# Patient Record
Sex: Male | Born: 1945 | Race: White | Hispanic: No | Marital: Married | Smoking: Former smoker
Health system: Southern US, Community
[De-identification: ages and names within clinical notes are randomized; demographics above are authoritative.]

## PROBLEM LIST (undated history)

## (undated) ENCOUNTER — Emergency Department (HOSPITAL_COMMUNITY): Disposition: A | Discharge status: Home / Self Care | Payer: Federal, State, Local not specified - PPO

## (undated) DIAGNOSIS — N401 Enlarged prostate with lower urinary tract symptoms: Secondary | ICD-10-CM

## (undated) DIAGNOSIS — Z9049 Acquired absence of other specified parts of digestive tract: Secondary | ICD-10-CM

## (undated) DIAGNOSIS — F32A Depression, unspecified: Secondary | ICD-10-CM

## (undated) DIAGNOSIS — E039 Hypothyroidism, unspecified: Secondary | ICD-10-CM

## (undated) DIAGNOSIS — M25541 Pain in joints of right hand: Secondary | ICD-10-CM

## (undated) DIAGNOSIS — G919 Hydrocephalus, unspecified: Secondary | ICD-10-CM

## (undated) DIAGNOSIS — I639 Cerebral infarction, unspecified: Secondary | ICD-10-CM

## (undated) DIAGNOSIS — E559 Vitamin D deficiency, unspecified: Secondary | ICD-10-CM

## (undated) DIAGNOSIS — F329 Major depressive disorder, single episode, unspecified: Secondary | ICD-10-CM

## (undated) DIAGNOSIS — K5730 Diverticulosis of large intestine without perforation or abscess without bleeding: Secondary | ICD-10-CM

## (undated) DIAGNOSIS — K5790 Diverticulosis of intestine, part unspecified, without perforation or abscess without bleeding: Secondary | ICD-10-CM

## (undated) DIAGNOSIS — G8929 Other chronic pain: Secondary | ICD-10-CM

## (undated) DIAGNOSIS — F411 Generalized anxiety disorder: Secondary | ICD-10-CM

## (undated) DIAGNOSIS — G2581 Restless legs syndrome: Secondary | ICD-10-CM

## (undated) DIAGNOSIS — H903 Sensorineural hearing loss, bilateral: Secondary | ICD-10-CM

## (undated) DIAGNOSIS — E739 Lactose intolerance, unspecified: Secondary | ICD-10-CM

## (undated) DIAGNOSIS — J309 Allergic rhinitis, unspecified: Secondary | ICD-10-CM

## (undated) DIAGNOSIS — I10 Essential (primary) hypertension: Secondary | ICD-10-CM

## (undated) DIAGNOSIS — H401130 Primary open-angle glaucoma, bilateral, stage unspecified: Secondary | ICD-10-CM

## (undated) DIAGNOSIS — Q631 Lobulated, fused and horseshoe kidney: Secondary | ICD-10-CM

## (undated) DIAGNOSIS — G43719 Chronic migraine without aura, intractable, without status migrainosus: Secondary | ICD-10-CM

## (undated) DIAGNOSIS — Z982 Presence of cerebrospinal fluid drainage device: Secondary | ICD-10-CM

## (undated) DIAGNOSIS — K642 Third degree hemorrhoids: Secondary | ICD-10-CM

## (undated) DIAGNOSIS — E785 Hyperlipidemia, unspecified: Secondary | ICD-10-CM

## (undated) DIAGNOSIS — M25532 Pain in left wrist: Secondary | ICD-10-CM

## (undated) HISTORY — DX: Vitamin D deficiency, unspecified: E55.9

## (undated) HISTORY — DX: Lactose intolerance, unspecified: E73.9

## (undated) HISTORY — DX: Major depressive disorder, single episode, unspecified: F32.9

## (undated) HISTORY — DX: Pain in left wrist: M25.532

## (undated) HISTORY — PX: CERVICAL SPINE SURGERY: SHX589

## (undated) HISTORY — PX: KIDNEY SURGERY: SHX687

## (undated) HISTORY — DX: Other chronic pain: G89.29

## (undated) HISTORY — DX: Primary open-angle glaucoma, bilateral, stage unspecified: H40.1130

## (undated) HISTORY — DX: Allergic rhinitis, unspecified: J30.9

## (undated) HISTORY — DX: Benign prostatic hyperplasia with lower urinary tract symptoms: N40.1

## (undated) HISTORY — DX: Restless legs syndrome: G25.81

## (undated) HISTORY — DX: Generalized anxiety disorder: F41.1

## (undated) HISTORY — DX: Third degree hemorrhoids: K64.2

## (undated) HISTORY — DX: Presence of cerebrospinal fluid drainage device: Z98.2

## (undated) HISTORY — DX: Pain in joints of right hand: M25.541

## (undated) HISTORY — DX: Sensorineural hearing loss, bilateral: H90.3

## (undated) HISTORY — PX: PARATHYROIDECTOMY: SHX19

## (undated) HISTORY — DX: Diverticulosis of large intestine without perforation or abscess without bleeding: K57.30

## (undated) HISTORY — DX: Acquired absence of other specified parts of digestive tract: Z90.49

## (undated) HISTORY — PX: THYROIDECTOMY, PARTIAL: SHX18

## (undated) HISTORY — DX: Chronic migraine without aura, intractable, without status migrainosus: G43.719

## (undated) HISTORY — PX: PARTIAL COLECTOMY: SHX5273

---

## 1975-03-30 DIAGNOSIS — G919 Hydrocephalus, unspecified: Secondary | ICD-10-CM | POA: Insufficient documentation

## 1999-12-02 DIAGNOSIS — E213 Hyperparathyroidism, unspecified: Secondary | ICD-10-CM

## 1999-12-02 HISTORY — DX: Hyperparathyroidism, unspecified: E21.3

## 2000-06-22 DIAGNOSIS — D126 Benign neoplasm of colon, unspecified: Secondary | ICD-10-CM

## 2000-06-22 HISTORY — DX: Benign neoplasm of colon, unspecified: D12.6

## 2001-09-26 DIAGNOSIS — M899 Disorder of bone, unspecified: Secondary | ICD-10-CM

## 2001-09-26 HISTORY — DX: Disorder of bone, unspecified: M89.9

## 2001-12-08 DIAGNOSIS — C649 Malignant neoplasm of unspecified kidney, except renal pelvis: Secondary | ICD-10-CM

## 2001-12-08 HISTORY — DX: Malignant neoplasm of unspecified kidney, except renal pelvis: C64.9

## 2009-03-29 DIAGNOSIS — K219 Gastro-esophageal reflux disease without esophagitis: Secondary | ICD-10-CM | POA: Insufficient documentation

## 2011-01-20 DIAGNOSIS — N209 Urinary calculus, unspecified: Secondary | ICD-10-CM

## 2011-01-20 HISTORY — DX: Urinary calculus, unspecified: N20.9

## 2011-02-17 ENCOUNTER — Other Ambulatory Visit: Payer: Self-pay

## 2011-02-17 ENCOUNTER — Encounter: Payer: Self-pay | Admitting: Emergency Medicine

## 2011-02-17 ENCOUNTER — Inpatient Hospital Stay (HOSPITAL_COMMUNITY)
Admission: EM | Admit: 2011-02-17 | Discharge: 2011-02-25 | DRG: 378 | Disposition: A | Discharge status: Home / Self Care | Payer: Medicare (Managed Care) | Attending: Internal Medicine | Admitting: Internal Medicine

## 2011-02-17 DIAGNOSIS — R509 Fever, unspecified: Secondary | ICD-10-CM

## 2011-02-17 DIAGNOSIS — N183 Chronic kidney disease, stage 3 unspecified: Secondary | ICD-10-CM | POA: Diagnosis present

## 2011-02-17 DIAGNOSIS — K922 Gastrointestinal hemorrhage, unspecified: Secondary | ICD-10-CM

## 2011-02-17 DIAGNOSIS — G2581 Restless legs syndrome: Secondary | ICD-10-CM | POA: Diagnosis present

## 2011-02-17 DIAGNOSIS — I10 Essential (primary) hypertension: Secondary | ICD-10-CM

## 2011-02-17 DIAGNOSIS — K626 Ulcer of anus and rectum: Secondary | ICD-10-CM | POA: Diagnosis present

## 2011-02-17 DIAGNOSIS — Z85528 Personal history of other malignant neoplasm of kidney: Secondary | ICD-10-CM

## 2011-02-17 DIAGNOSIS — M129 Arthropathy, unspecified: Secondary | ICD-10-CM | POA: Diagnosis present

## 2011-02-17 DIAGNOSIS — G43909 Migraine, unspecified, not intractable, without status migrainosus: Secondary | ICD-10-CM | POA: Diagnosis present

## 2011-02-17 DIAGNOSIS — E785 Hyperlipidemia, unspecified: Secondary | ICD-10-CM | POA: Diagnosis present

## 2011-02-17 DIAGNOSIS — I959 Hypotension, unspecified: Secondary | ICD-10-CM | POA: Diagnosis present

## 2011-02-17 DIAGNOSIS — E039 Hypothyroidism, unspecified: Secondary | ICD-10-CM | POA: Diagnosis present

## 2011-02-17 DIAGNOSIS — I129 Hypertensive chronic kidney disease with stage 1 through stage 4 chronic kidney disease, or unspecified chronic kidney disease: Secondary | ICD-10-CM | POA: Diagnosis present

## 2011-02-17 DIAGNOSIS — K5900 Constipation, unspecified: Secondary | ICD-10-CM | POA: Diagnosis not present

## 2011-02-17 DIAGNOSIS — F3289 Other specified depressive episodes: Secondary | ICD-10-CM | POA: Diagnosis present

## 2011-02-17 DIAGNOSIS — Z8673 Personal history of transient ischemic attack (TIA), and cerebral infarction without residual deficits: Secondary | ICD-10-CM

## 2011-02-17 DIAGNOSIS — Z7982 Long term (current) use of aspirin: Secondary | ICD-10-CM

## 2011-02-17 DIAGNOSIS — K5731 Diverticulosis of large intestine without perforation or abscess with bleeding: Principal | ICD-10-CM | POA: Diagnosis present

## 2011-02-17 DIAGNOSIS — F329 Major depressive disorder, single episode, unspecified: Secondary | ICD-10-CM | POA: Diagnosis present

## 2011-02-17 DIAGNOSIS — D72829 Elevated white blood cell count, unspecified: Secondary | ICD-10-CM | POA: Diagnosis not present

## 2011-02-17 DIAGNOSIS — Z791 Long term (current) use of non-steroidal anti-inflammatories (NSAID): Secondary | ICD-10-CM

## 2011-02-17 DIAGNOSIS — I639 Cerebral infarction, unspecified: Secondary | ICD-10-CM

## 2011-02-17 DIAGNOSIS — D500 Iron deficiency anemia secondary to blood loss (chronic): Secondary | ICD-10-CM | POA: Diagnosis present

## 2011-02-17 DIAGNOSIS — D126 Benign neoplasm of colon, unspecified: Secondary | ICD-10-CM | POA: Diagnosis present

## 2011-02-17 HISTORY — DX: Essential (primary) hypertension: I10

## 2011-02-17 HISTORY — DX: Major depressive disorder, single episode, unspecified: F32.9

## 2011-02-17 HISTORY — DX: Lobulated, fused and horseshoe kidney: Q63.1

## 2011-02-17 HISTORY — DX: Restless legs syndrome: G25.81

## 2011-02-17 HISTORY — DX: Depression, unspecified: F32.A

## 2011-02-17 HISTORY — DX: Hypothyroidism, unspecified: E03.9

## 2011-02-17 HISTORY — DX: Cerebral infarction, unspecified: I63.9

## 2011-02-17 HISTORY — DX: Hyperlipidemia, unspecified: E78.5

## 2011-02-17 HISTORY — DX: Diverticulosis of intestine, part unspecified, without perforation or abscess without bleeding: K57.90

## 2011-02-17 HISTORY — DX: Hydrocephalus, unspecified: G91.9

## 2011-02-17 HISTORY — DX: Chronic kidney disease, stage 3 unspecified: N18.30

## 2011-02-17 LAB — CBC
Hemoglobin: 14.9 g/dL (ref 13.0–17.0)
Hemoglobin: 14.1 g/dL (ref 13.0–17.0)
MCH: 30.5 pg (ref 26.0–34.0)
MCH: 30.5 pg (ref 26.0–34.0)
MCHC: 33.3 g/dL (ref 30.0–36.0)
RBC: 4.89 MIL/uL (ref 4.22–5.81)

## 2011-02-17 LAB — COMPREHENSIVE METABOLIC PANEL
ALT: 29 U/L (ref 0–53)
Alkaline Phosphatase: 47 U/L (ref 39–117)
CO2: 28 mEq/L (ref 19–32)
Calcium: 10.8 mg/dL — ABNORMAL HIGH (ref 8.4–10.5)
GFR calc Af Amer: 37 mL/min — ABNORMAL LOW (ref >90)
GFR calc non Af Amer: 32 mL/min — ABNORMAL LOW (ref >90)
Glucose, Bld: 134 mg/dL — ABNORMAL HIGH (ref 70–99)
Sodium: 141 mEq/L (ref 135–145)

## 2011-02-17 LAB — PROTIME-INR: INR: 1.01 (ref 0.00–1.49)

## 2011-02-17 MED ORDER — SODIUM CHLORIDE 0.9 % IV BOLUS (SEPSIS)
1000.0000 mL | Freq: Once | INTRAVENOUS | Status: AC
  Administered 2011-02-17: 1000 mL via INTRAVENOUS

## 2011-02-17 MED ORDER — SODIUM CHLORIDE 0.9 % IV SOLN
INTRAVENOUS | Status: AC
  Administered 2011-02-17: 19:00:00 via INTRAVENOUS

## 2011-02-17 NOTE — ED Provider Notes (Signed)
History     CSN: [id number] Arrival date & time: 02/17/2011  3:46 PM   First MD Initiated Contact with Patient 02/17/11 1613      Chief Complaint  Patient presents with  . Rectal Bleeding    (Consider location/radiation/quality/duration/timing/severity/associated sxs/prior treatment) HPI Pt. Complains of brisk painless rectal bleeding with weakness and dizziness.  Hx of diverticulosis.  Pt also takes Plavix and asa because of previous CVA.  No hx of PUD or other GI bleeding..  No ETOH abuse per patient.   Past Medical History  Diagnosis Date  . CVA (cerebral infarction)   . Hydrocephalus     with shunt  . Hypothyroid   . Renal disorder   . Cancer   . Depression   . Restless leg syndrome   . Hyperlipemia   . Hypertension     Past Surgical History  Procedure Date  . Colon surgery   . Kidney surgery     No family history on file.  History  Substance Use Topics  . Smoking status: Not on file  . Smokeless tobacco: Not on file  . Alcohol Use:       Review of Systems  Unable to perform ROS   Allergies  Review of patient's allergies indicates not on file.  Home Medications  No current outpatient prescriptions on file.  BP 96/67  Pulse 88  Temp(Src) 98.3 F (36.8 C) (Oral)  Resp 16  SpO2 93%  Physical Exam  Nursing note and vitals reviewed. Constitutional: He is oriented to person, place, and time. He appears well-developed and well-nourished. He appears distressed.  HENT:  Head: Normocephalic and atraumatic.  Eyes: Pupils are equal, round, and reactive to light.  Neck: Normal range of motion.  Cardiovascular: Normal rate, regular rhythm and intact distal pulses.          Date: 02/17/2011  Rate: 83  Rhythm: normal sinus rhythm  QRS Axis: normal  Intervals: normal  ST/T Wave abnormalities: nonspecific T wave changes  Conduction Disutrbances:none:   Old EKG Reviewed: none available     Pulmonary/Chest: No respiratory distress.  Abdominal:  Normal appearance. He exhibits no distension.  Musculoskeletal: Normal range of motion.  Neurological: He is alert and oriented to person, place, and time. No cranial nerve deficit.  Skin: Skin is warm and dry. No bruising and no rash noted. There is pallor.  Psychiatric: He has a normal mood and affect. His behavior is normal.    ED Course  Procedures (including critical care time)   Dr. Juanda Chance consulted.  Will see in hospital  Patient admitted to [facility]   Labs Reviewed  CBC - Abnormal; Notable for the following:    WBC 16.2 (*)    All other components within normal limits  COMPREHENSIVE METABOLIC PANEL - Abnormal; Notable for the following:    Glucose, Bld 134 (*)    BUN 25 (*)    Creatinine, Ser 2.05 (*)    Calcium 10.8 (*)    GFR calc non Af Amer 32 (*)    GFR calc Af Amer 37 (*)    All other components within normal limits  CBC - Abnormal; Notable for the following:    WBC 11.5 (*)    All other components within normal limits  BASIC METABOLIC PANEL - Abnormal; Notable for the following:    Glucose, Bld 104 (*)    Creatinine, Ser 1.37 (*)    GFR calc non Af Amer 52 (*)    GFR calc  Af Amer 60 (*)    All other components within normal limits  CBC - Abnormal; Notable for the following:    RBC 3.98 (*)    Hemoglobin 12.0 (*)    HCT 36.4 (*)    All other components within normal limits  TYPE AND SCREEN  PROTIME-INR  ABO/RH  MRSA PCR SCREENING  CBC  CBC  CBC  PREPARE RBC (CROSSMATCH)   No results found.   1. Lower GI bleed   2. Hypotension       MDM  CRITICAL CARE Performed by: Nelva Nay L   Total critical care time: 30  Critical care time was exclusive of separately billable procedures and treating other patients.  Critical care was necessary to treat or prevent imminent or life-threatening deterioration.  Critical care was time spent personally by me on the following activities: development of treatment plan with patient and/or  surrogate as well as nursing, discussions with consultants, evaluation of patient's response to treatment, examination of patient, obtaining history from patient or surrogate, ordering and performing treatments and interventions, ordering and review of laboratory studies, ordering and review of radiographic studies, pulse oximetry and re-evaluation of patient's condition.        Nelia Shi, MD 02/18/11 1125

## 2011-02-17 NOTE — ED Notes (Signed)
MD at bedside. 

## 2011-02-17 NOTE — ED Notes (Signed)
Pt's wife reports he passed a large amount of blood per rectum x4 starting around 1230 today. Pt with hx diverticulosis and takes anti-platlet medications for CVA hx

## 2011-02-17 NOTE — Consult Note (Signed)
[facility] Consultation  Referring Prov Primary Care Physician:   Primary Gastroenterologist:  [facility].  Reason for Consultation:  Large volume hematochezia  HPI: Gregory Kline is a 65 y.o. male just moved to [place] from [place], [place],  In Oct 2012, today developed  BRB per rectum around 12.30 pm, followed by 4 bloody stools. Denies abd. Pain. No stools x 3 hours. Initially hypotensive, responded to fluid challenge. Hx of segmental colectomy for diverticulitis with abcess in 1996, since then several colonoscopies, last one about 11/2 years ago at [facility]. No polyps. Denies change in bowl habits. Hx of stroke 03/2007, 12/2008, and in 2011 resulting in poor memory. Wife gives most of the hx. He has been on Plavix long term. Hx horseshoe kidney, s/p halfnephrectomy. For renal carcinoma. No F hx of colon cancer   Past Medical History  Diagnosis Date  . CVA (cerebral infarction)   . Hydrocephalus     with shunt  . Hypothyroid   . Diverticulosis   . Depression   . Restless leg syndrome   . Hyperlipemia   . Hypertension   . Horseshoe kidney   . Renal cell carcinoma     Past Surgical History  Procedure Date  . Thyroidectomy, partial   . Kidney surgery   . Partial colectomy   . Parathyroidectomy   . Cervical spine surgery     benign tumor    Prior to Admission medications   Medication Sig Start Date End Date Taking? Authorizing Provider  atorvastatin (LIPITOR) 20 MG tablet Take 40 mg by mouth daily.    Yes Historical Provider, MD  azithromycin (ZITHROMAX) 500 MG tablet Take 500 mg by mouth daily.     Yes Historical Provider, MD  clopidogrel (PLAVIX) 75 MG tablet Take 75 mg by mouth daily.     Yes Historical Provider, MD  dextran 70-hypromellose (TEARS RENEWED) ophthalmic solution Place 1 drop into both eyes 2 (two) times daily.     Yes Historical Provider, MD  DULoxetine (CYMBALTA) 60 MG capsule Take 60 mg by mouth 2 (two) times daily.     Yes  Historical Provider, MD  finasteride (PROSCAR) 5 MG tablet Take 5 mg by mouth daily.     Yes Historical Provider, MD  levothyroxine (SYNTHROID, LEVOTHROID) 100 MCG tablet Take 100 mcg by mouth at bedtime.    Yes Historical Provider, MD  loratadine (CLARITIN REDITABS) 10 MG dissolvable tablet Take 10 mg by mouth as needed. For eyes dryness and itching   Yes Historical Provider, MD  losartan-hydrochlorothiazide (HYZAAR) 100-25 MG per tablet Take 1 tablet by mouth daily.     Yes Historical Provider, MD  pramipexole (MIRAPEX) 0.5 MG tablet Take 0.5 mg by mouth 1 day or 1 dose.     Yes Historical Provider, MD  risedronate (ACTONEL) 35 MG tablet Take 35 mg by mouth every 7 (seven) days. with water on empty stomach, nothing by mouth or lie down for next 30 minutes.takes on sunday   Yes Historical Provider, MD  simvastatin (ZOCOR) 20 MG tablet Take 20 mg by mouth at bedtime.     Yes Historical Provider, MD  Travoprost, BAK Free, (TRAVATAN) 0.004 % SOLN ophthalmic solution Place 1 drop into both eyes at bedtime.     Yes Historical Provider, MD  acetaminophen-codeine (TYLENOL #3) 300-30 MG per tablet Take 1 tablet by mouth every 4 (four) hours as needed.      Historical Provider, MD  almotriptan (AXERT) 12.5 MG tablet Take  12.5 mg by mouth as needed. may repeat in 2 hours if needed     Historical Provider, MD  aspirin 325 MG tablet Take 325 mg by mouth daily.      Historical Provider, MD  diclofenac sodium (VOLTAREN) 1 % GEL Apply topically.      Historical Provider, MD    Current Facility-Administered Medications  Medication Dose Route Frequency Provider Last Rate Last Dose  . 0.9 %  sodium chloride infusion   Intravenous STAT Nelia Shi, MD 125 mL/hr at 02/17/11 1912    . sodium chloride 0.9 % bolus 1,000 mL  1,000 mL Intravenous Once Nelia Shi, MD   1,000 mL at 02/17/11 1627  . sodium chloride 0.9 % bolus 1,000 mL  1,000 mL Intravenous Once Nelia Shi, MD   1,000 mL at 02/17/11 1627    Current Outpatient Prescriptions  Medication Sig Dispense Refill  . atorvastatin (LIPITOR) 20 MG tablet Take 40 mg by mouth daily.       Marland Kitchen azithromycin (ZITHROMAX) 500 MG tablet Take 500 mg by mouth daily.        . clopidogrel (PLAVIX) 75 MG tablet Take 75 mg by mouth daily.        Marland Kitchen dextran 70-hypromellose (TEARS RENEWED) ophthalmic solution Place 1 drop into both eyes 2 (two) times daily.        . DULoxetine (CYMBALTA) 60 MG capsule Take 60 mg by mouth 2 (two) times daily.        . finasteride (PROSCAR) 5 MG tablet Take 5 mg by mouth daily.        Marland Kitchen levothyroxine (SYNTHROID, LEVOTHROID) 100 MCG tablet Take 100 mcg by mouth at bedtime.       Marland Kitchen loratadine (CLARITIN REDITABS) 10 MG dissolvable tablet Take 10 mg by mouth as needed. For eyes dryness and itching      . losartan-hydrochlorothiazide (HYZAAR) 100-25 MG per tablet Take 1 tablet by mouth daily.        . pramipexole (MIRAPEX) 0.5 MG tablet Take 0.5 mg by mouth 1 day or 1 dose.        . risedronate (ACTONEL) 35 MG tablet Take 35 mg by mouth every 7 (seven) days. with water on empty stomach, nothing by mouth or lie down for next 30 minutes.takes on sunday      . simvastatin (ZOCOR) 20 MG tablet Take 20 mg by mouth at bedtime.        . Travoprost, BAK Free, (TRAVATAN) 0.004 % SOLN ophthalmic solution Place 1 drop into both eyes at bedtime.        Marland Kitchen acetaminophen-codeine (TYLENOL #3) 300-30 MG per tablet Take 1 tablet by mouth every 4 (four) hours as needed.        Marland Kitchen almotriptan (AXERT) 12.5 MG tablet Take 12.5 mg by mouth as needed. may repeat in 2 hours if needed       . aspirin 325 MG tablet Take 325 mg by mouth daily.        . diclofenac sodium (VOLTAREN) 1 % GEL Apply topically.          Allergies as of 02/17/2011 - Review Complete 02/17/2011  Allergen Reaction Noted  . Cafergot Rash 02/17/2011  . Celebrex (celecoxib) Rash 02/17/2011  . Latex Rash 02/17/2011  . Sulfa antibiotics Rash 02/17/2011    Family History  Problem  Relation Age of Onset  . Cancer Mother   . Leukemia Father     History   Social History  .  Marital Status: Married    Spouse Name: N/A    Number of Children: N/A  . Years of Education: N/A   Occupational History  . Not on file.   Social History Main Topics  . Smoking status: Former Games developer  . Smokeless tobacco: Never Used  . Alcohol Use: 0.5 oz/week    1 drink(s) per week     occassional  . Drug Use: No     quit in teens (smoked approx. 6 months)  . Sexually Active: Not on file   Other Topics Concern  . Not on file   Social History Narrative  . No narrative on file    Review of Systems: Gen: Denies any fever, chills, sweats, anorexia, fatigue, weakness, malaise, weight loss, and sleep disorder CV: Denies chest pain, angina, palpitations, syncope, orthopnea, PND, peripheral edema, and claudication. Resp: Denies dyspnea at rest, dyspnea with exercise, cough, sputum, wheezing, coughing up blood, and pleurisy. GI: Denies vomiting blood, jaundice, and fecal incontinence.   Denies dysphagia or odynophagia. GU : Denies urinary burning, blood in urine, urinary frequency, urinary hesitancy, nocturnal urination, and urinary incontinence. MS: Denies joint pain, limitation of movement, and swelling, stiffness, low back pain, extremity pain. Denies muscle weakness, cramps, atrophy.  .  Psych: hx of depression, anxiety, memory loss,, and confusion. Heme: Denies bruising, bleeding, and enlarged lymph nodes. Neuro:  Denies any headaches, dizziness, paresthesias. Endo:  Denies any problems with DM, thyroid, adrenal function.  Physical Exam: Vital signs in last 24 hours: Temp:  [98 F (36.7 C)-98.3 F (36.8 C)] 98 F (36.7 C) (11/21 1919) Pulse Rate:  [75-90] 85  (11/21 2100) Resp:  [10-22] 15  (11/21 2100) BP: (62-126)/(37-79) 100/71 mmHg (11/21 2100) SpO2:  [93 %-100 %] 98 % (11/21 2100)   General:   Alert,  Well-developed, well-nourished, pleasant and cooperative in  NAD Head:  Normocephalic and atraumatic. Eyes:  Sclera clear, no icterus.   Conjunctiva pink. Ears:  Normal auditory acuity. Nose:  No deformity, discharge,  or lesions. Mouth:  No deformity or lesions.  Oropharynx pink & moist. Neck:  Supple; no masses or thyromegaly. Lungs:  Clear throughout to auscultation.   No wheezes, crackles, or rhonchi. No acute distress. Heart:  Regular rate and rhythm; no murmurs, clicks, rubs,  or gallops. Abdomen:    Soft, decreased muscle tone, well healed midline scar, active bowl sounds, no mass, mild diffuse tenderness Rectal.  Gregory Kline blood, no stool, small amount Msk:  Symmetrical without gross deformities. Normal posture. Pulses:  Normal pulses noted. Extremities:  Without clubbing or edema. Neurologic:  Alert and  oriented x4;  grossly normal neurologically. Skin:  Intact without significant lesions or rashes.  Psych:  Alert and cooperative. Normal mood and affect.  Intake/Output from previous day:   Intake/Output this shift: Total I/O In: -  Out: 175 [Urine:175]  Lab Results:  [facility] 02/17/11 1610  WBC 16.2*  HGB 14.9  HCT 44.9  PLT 242   BMET  Basename 02/17/11 1610  NA 141  K 4.0  CL 104  CO2 28  GLUCOSE 134*  BUN 25*  CREATININE 2.05*  CALCIUM 10.8*   LFT  Basename 02/17/11 1610  PROT 6.6  ALBUMIN 4.0  AST 19  ALT 29  ALKPHOS 47  BILITOT 0.5  BILIDIR --  IBILI --   PT/INR  Basename 02/17/11 1610  LABPROT 13.5  INR 1.01   Hepatitis Panel No results found for this basename: HEPBSAG,HCVAB,HEPAIGM,HEPBIGM in the last 72 hours   Studies/Results:  No results found.   Previous Endoscopies: See HPI  Impression / Plan:Acute LGIB  Associated with transient hypotension, responded to fluid resuscitation, now hemodynamically stable, no stools x 3 hours. 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

## 2011-02-17 NOTE — H&P (Signed)
History and Physical  Gregory Kline WJX:[id number] DOB: [birth year] DOA: 02/17/2011  Referring physician: Nelva Nay, M.D. PCP:  Patient just relocated recently from [place]. [facility].  Chief Complaint: Bleeding  HPI:  65 year old man who presents with a history of painless lower GI bleeding. He occasionally has a little bit of blood with stools which he has attributed to hemorrhoids recently had frank bleeding before. Today he had 4 discrete episodes of voluminous bright red blood per rectum. He does have a history of diverticulosis and last had colonoscopy approximately one to 2 years ago in [place]. He has had no bleeding here in the emergency department however he was noted be hypotensive and in discussion with emergency department physician it is felt the patient should be amended to step down unit. Workup was notable for a creatinine 2.05 and a leukocytosis of 15.2. Hemoglobin was unremarkable. EKG was unremarkable. Gastroenterology is ready been consulted by the emergency department.  Review of Systems:  Negative for fever, changes to his vision, sore throat, rash, muscle aches, chest pain, shortness of breath, dysuria, abdominal pain, nausea, vomiting.  Past Medical History  Diagnosis Date  . CVA (cerebral infarction)   . Hydrocephalus     with shunt  . Hypothyroid   . Diverticulosis   . Depression   . Restless leg syndrome   . Hyperlipemia   . Hypertension   . Horseshoe kidney   . Renal cell carcinoma    Past Surgical History  Procedure Date  . Thyroidectomy, partial   . Kidney surgery   . Partial colectomy   . Parathyroidectomy   . Cervical spine surgery     benign tumor   Social History:  reports that he has quit smoking. He has never used smokeless tobacco. He reports that he drinks about .5 ounces of alcohol per week. He reports that he does not use illicit drugs.  Allergies  Allergen Reactions  . Cafergot Rash  . Celebrex (Celecoxib) Rash  .  Latex Rash    Sensitivity per pt  . Sulfa Antibiotics Rash   Family History  Problem Relation Age of Onset  . Cancer Mother   . Leukemia Father    Prior to Admission medications   Medication Sig Start Date End Date Taking? Authorizing Provider  atorvastatin (LIPITOR) 20 MG tablet Take 40 mg by mouth daily.    Yes Historical Provider, MD  azithromycin (ZITHROMAX) 500 MG tablet Take 500 mg by mouth daily.     Yes Historical Provider, MD  clopidogrel (PLAVIX) 75 MG tablet Take 75 mg by mouth daily.     Yes Historical Provider, MD  dextran 70-hypromellose (TEARS RENEWED) ophthalmic solution Place 1 drop into both eyes 2 (two) times daily.     Yes Historical Provider, MD  DULoxetine (CYMBALTA) 60 MG capsule Take 60 mg by mouth 2 (two) times daily.     Yes Historical Provider, MD  finasteride (PROSCAR) 5 MG tablet Take 5 mg by mouth daily.     Yes Historical Provider, MD  levothyroxine (SYNTHROID, LEVOTHROID) 100 MCG tablet Take 100 mcg by mouth at bedtime.    Yes Historical Provider, MD  loratadine (CLARITIN REDITABS) 10 MG dissolvable tablet Take 10 mg by mouth as needed. For eyes dryness and itching   Yes Historical Provider, MD  losartan-hydrochlorothiazide (HYZAAR) 100-25 MG per tablet Take 1 tablet by mouth daily.     Yes Historical Provider, MD  pramipexole (MIRAPEX) 0.5 MG tablet Take 0.5 mg by mouth 1 day or  1 dose.     Yes Historical Provider, MD  risedronate (ACTONEL) 35 MG tablet Take 35 mg by mouth every 7 (seven) days. with water on empty stomach, nothing by mouth or lie down for next 30 minutes.takes on sunday   Yes Historical Provider, MD  simvastatin (ZOCOR) 20 MG tablet Take 20 mg by mouth at bedtime.     Yes Historical Provider, MD  Travoprost, BAK Free, (TRAVATAN) 0.004 % SOLN ophthalmic solution Place 1 drop into both eyes at bedtime.     Yes Historical Provider, MD  acetaminophen-codeine (TYLENOL #3) 300-30 MG per tablet Take 1 tablet by mouth every 4 (four) hours as needed.       Historical Provider, MD  almotriptan (AXERT) 12.5 MG tablet Take 12.5 mg by mouth as needed. may repeat in 2 hours if needed     Historical Provider, MD  aspirin 325 MG tablet Take 325 mg by mouth daily.      Historical Provider, MD  diclofenac sodium (VOLTAREN) 1 % GEL Apply topically.      Historical Provider, MD   Physical Exam: Blood pressure 126/66, pulse 75, temperature 98.3 F (36.8 C), temperature source Oral, resp. rate 10, SpO2 99.00%.   General:  Well-appearing. Examined while sitting up in emergency department stretcher.  Eyes: Pupils equal round reactive to light. Normal lids, irises the conjunctiva.  ENT: Somewhat hard of hearing. Normal lips and tongue.  Neck: No lymphadenopathy or masses. No thyromegaly.  Cardiovascular: Regular rate and rhythm. No murmur, rub or gallop.  Respiratory: Clear to auscultation bilaterally. No wheezes, rales or rhonchi. Normal respiratory effort.  Abdomen: Soft, nontender nondistended.  Skin: Appears grossly normal without rash or induration.  Musculoskeletal: Grossly normal tone and strength in the upper and lower showed extremities bilaterally.  Psychiatric: Grossly normal mood and affect.  Neurologic: Grossly normal.  Labs on Admission:  Basic Metabolic Panel:  Lab 02/17/11 1610  NA 141  K 4.0  CL 104  CO2 28  GLUCOSE 134*  BUN 25*  CREATININE 2.05*  CALCIUM 10.8*  MG --  PHOS --   Liver Function Tests:  Lab 02/17/11 1610  AST 19  ALT 29  ALKPHOS 47  BILITOT 0.5  PROT 6.6  ALBUMIN 4.0   CBC:  Lab 02/17/11 1610  WBC 16.2*  NEUTROABS --  HGB 14.9  HCT 44.9  MCV 91.8  PLT 242   EKG: Independently reviewed. 1556: Normal sinus rhythm. Left axis deviation. No acute changes. 1607: Normal sinus rhythm. Left axis deviation. Left anterior fascicular block. No acute changes.  Assessment/Plan 1. Lower GI bleed/hypotension: Most likely diverticular given his history and known diverticulosis. No bleeding in the  emergency department. Serial CBCs. Blood pressure has responded well to IV fluids. Continue IV fluids for blood pressure support. Asymptomatic. 2. 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 3. Hypertension: Stable. 4. Hypothyroidism: Stable. 5. History of stroke: Hold aspirin and Plavix right now given above.  Code Status: Full Family Communication: Spoke with wife at bedside. Disposition Plan: Home when stable.     Brendia Sacks, MD  [facility] Pager [phone] 02/17/2011, 7:11 PM

## 2011-02-18 ENCOUNTER — Inpatient Hospital Stay (HOSPITAL_COMMUNITY): Payer: Medicare (Managed Care)

## 2011-02-18 ENCOUNTER — Encounter (HOSPITAL_COMMUNITY): Payer: Self-pay | Admitting: Radiology

## 2011-02-18 LAB — CBC
HCT: 29.9 % — ABNORMAL LOW (ref 39.0–52.0)
Hemoglobin: 10.6 g/dL — ABNORMAL LOW (ref 13.0–17.0)
MCH: 30.2 pg (ref 26.0–34.0)
MCHC: 33 g/dL (ref 30.0–36.0)
MCHC: 35.5 g/dL (ref 30.0–36.0)
Platelets: 178 10*3/uL (ref 150–400)
RBC: 3.98 MIL/uL — ABNORMAL LOW (ref 4.22–5.81)
RBC: 3.34 MIL/uL — ABNORMAL LOW (ref 4.22–5.81)
RDW: 13.1 % (ref 11.5–15.5)
WBC: 17.4 10*3/uL — ABNORMAL HIGH (ref 4.0–10.5)

## 2011-02-18 LAB — BASIC METABOLIC PANEL
Calcium: 9.1 mg/dL (ref 8.4–10.5)
GFR calc non Af Amer: 52 mL/min — ABNORMAL LOW (ref >90)
Glucose, Bld: 104 mg/dL — ABNORMAL HIGH (ref 70–99)
Sodium: 138 mEq/L (ref 135–145)

## 2011-02-18 LAB — MRSA PCR SCREENING: MRSA by PCR: NEGATIVE

## 2011-02-18 MED ORDER — ONDANSETRON HCL 4 MG PO TABS: 4.0000 mg | ORAL_TABLET | Freq: Four times a day (QID) | ORAL | Status: DC | PRN

## 2011-02-18 MED ORDER — ACETAMINOPHEN 325 MG PO TABS: 650.0000 mg | ORAL_TABLET | Freq: Four times a day (QID) | ORAL | Status: DC | PRN

## 2011-02-18 MED ORDER — TECHNETIUM TC 99M-LABELED RED BLOOD CELLS IV KIT
23.4000 | PACK | Freq: Once | INTRAVENOUS | Status: AC | PRN
Start: 2011-02-18 — End: 2011-02-18
  Administered 2011-02-18: 23.4 via INTRAVENOUS

## 2011-02-18 MED ORDER — ZOLPIDEM TARTRATE 5 MG PO TABS: 5.0000 mg | ORAL_TABLET | Freq: Every evening | ORAL | Status: DC | PRN

## 2011-02-18 MED ORDER — TRAVOPROST (BAK FREE) 0.004 % OP SOLN
1.0000 [drp] | Freq: Every day | OPHTHALMIC | Status: DC
  Administered 2011-02-18 – 2011-02-24 (×7): 1 [drp] via OPHTHALMIC
  Filled 2011-02-18: qty 2.5

## 2011-02-18 MED ORDER — LEVOTHYROXINE SODIUM 100 MCG PO TABS
100.0000 ug | ORAL_TABLET | Freq: Every day | ORAL | Status: DC
  Administered 2011-02-18 – 2011-02-24 (×6): 100 ug via ORAL
  Filled 2011-02-18 (×8): qty 1

## 2011-02-18 MED ORDER — MORPHINE SULFATE 2 MG/ML IJ SOLN
2.0000 mg | INTRAMUSCULAR | Status: DC | PRN
  Administered 2011-02-18: 2 mg via INTRAVENOUS
  Filled 2011-02-18: qty 1

## 2011-02-18 MED ORDER — TEARS RENEWED OP SOLN
1.0000 [drp] | Freq: Two times a day (BID) | OPHTHALMIC | Status: DC
  Administered 2011-02-18 (×2): 1 [drp] via OPHTHALMIC
  Filled 2011-02-18 (×3): qty 15

## 2011-02-18 MED ORDER — ACETAMINOPHEN 650 MG RE SUPP: 650.0000 mg | Freq: Four times a day (QID) | RECTAL | Status: DC | PRN

## 2011-02-18 MED ORDER — SIMVASTATIN 20 MG PO TABS
20.0000 mg | ORAL_TABLET | Freq: Every day | ORAL | Status: DC
  Administered 2011-02-20 – 2011-02-24 (×5): 20 mg via ORAL
  Filled 2011-02-18 (×9): qty 1

## 2011-02-18 MED ORDER — DULOXETINE HCL 60 MG PO CPEP
60.0000 mg | ORAL_CAPSULE | Freq: Two times a day (BID) | ORAL | Status: DC
  Administered 2011-02-18 – 2011-02-25 (×14): 60 mg via ORAL
  Filled 2011-02-18 (×17): qty 1

## 2011-02-18 MED ORDER — FINASTERIDE 5 MG PO TABS
5.0000 mg | ORAL_TABLET | Freq: Every day | ORAL | Status: DC
  Administered 2011-02-18: 5 mg via ORAL
  Filled 2011-02-18 (×2): qty 1

## 2011-02-18 MED ORDER — SODIUM CHLORIDE 0.9 % IV BOLUS (SEPSIS)
1000.0000 mL | Freq: Once | INTRAVENOUS | Status: AC
  Administered 2011-02-18: 1000 mL via INTRAVENOUS

## 2011-02-18 MED ORDER — SODIUM CHLORIDE 0.9 % IV SOLN
INTRAVENOUS | Status: DC
  Administered 2011-02-18 – 2011-02-20 (×2): via INTRAVENOUS

## 2011-02-18 MED ORDER — POLYVINYL ALCOHOL 1.4 % OP SOLN
1.0000 [drp] | Freq: Two times a day (BID) | OPHTHALMIC | Status: DC
  Administered 2011-02-18 – 2011-02-25 (×16): 1 [drp] via OPHTHALMIC
  Filled 2011-02-18: qty 15

## 2011-02-18 MED ORDER — ONDANSETRON HCL 4 MG/2ML IJ SOLN: 4.0000 mg | Freq: Four times a day (QID) | INTRAMUSCULAR | Status: DC | PRN

## 2011-02-18 MED ORDER — HYDROCODONE-ACETAMINOPHEN 5-325 MG PO TABS
1.0000 | ORAL_TABLET | ORAL | Status: DC | PRN
  Administered 2011-02-24 (×2): 2 via ORAL
  Filled 2011-02-18 (×2): qty 2

## 2011-02-18 MED ORDER — VITAMINS A & D EX OINT
TOPICAL_OINTMENT | CUTANEOUS | Status: AC
  Administered 2011-02-18: 18:00:00
  Filled 2011-02-18: qty 5

## 2011-02-18 NOTE — Progress Notes (Signed)
Subjective: Patient with 2 large BRBPR this morning with hypotension with SBP in 80s. Patient denies any abdominal pain. No SOB. No complaints.  Objective: Vital signs in last 24 hours: Filed Vitals:   02/17/11 2330 02/18/11 0215 02/18/11 0400 02/18/11 0620  BP: 114/83 125/76 116/82 131/90  Pulse:  76 85 94  Temp: 98.7 F (37.1 C)  97.6 F (36.4 C)   TempSrc: Oral  Oral   Resp:  17 16 18   Height: 5\' 9"  (1.753 m)     Weight: 85.6 kg (188 lb 11.4 oz)     SpO2:  98% 97% 100%    Intake/Output Summary (Last 24 hours) at 02/18/11 0843 Last data filed at 02/18/11 0600  Gross per 24 hour  Intake   1050 ml  Output   1225 ml  Net   -175 ml    Weight change:   General: Alert, awake, oriented x3, in no acute distress. Heart: Tachycardia, without murmurs, rubs, gallops. Lungs: Clear to auscultation bilaterally. Abdomen: Soft, nontender, nondistended, positive bowel sounds. Extremities: No clubbing cyanosis or edema with positive pedal pulses. Neuro: Grossly intact, nonfocal.   Lab Results:  [facility] 02/18/11 0542 02/17/11 1610  NA 138 141  K 4.0 4.0  CL 109 104  CO2 23 28  GLUCOSE 104* 134*  BUN 22 25*  CREATININE 1.37* 2.05*  CALCIUM 9.1 10.8*  MG -- --  PHOS -- --    Basename 02/17/11 1610  AST 19  ALT 29  ALKPHOS 47  BILITOT 0.5  PROT 6.6  ALBUMIN 4.0   No results found for this basename: LIPASE:2,AMYLASE:2 in the last 72 hours  Basename 02/18/11 0542 02/17/11 2250  WBC 9.5 11.5*  NEUTROABS -- --  HGB 12.0* 14.1  HCT 36.4* 42.3  MCV 91.5 91.6  PLT 178 205   No results found for this basename: CKTOTAL:3,CKMB:3,CKMBINDEX:3,TROPONINI:3 in the last 72 hours No results found for this basename: POCBNP:3 in the last 72 hours No results found for this basename: DDIMER:2 in the last 72 hours No results found for this basename: HGBA1C:2 in the last 72 hours No results found for this basename: CHOL:2,HDL:2,LDLCALC:2,TRIG:2,CHOLHDL:2,LDLDIRECT:2 in the last 72  hours No results found for this basename: TSH,T4TOTAL,FREET3,T3FREE,THYROIDAB in the last 72 hours No results found for this basename: VITAMINB12:2,FOLATE:2,FERRITIN:2,TIBC:2,IRON:2,RETICCTPCT:2 in the last 72 hours  Micro Results: Recent Results (from the past 240 hour(s))  MRSA PCR SCREENING     Status: Normal   Collection Time   02/18/11 12:42 AM      Component Value Range Status Comment   MRSA by PCR NEGATIVE  NEGATIVE  Final     Studies/Results: No results found.  Medications:    . sodium chloride   Intravenous STAT  . dextran 70-hypromellose  1 drop Both Eyes BID  . DULoxetine  60 mg Oral BID  . finasteride  5 mg Oral Daily  . levothyroxine  100 mcg Oral QHS  . polyvinyl alcohol  1 drop Both Eyes BID  . simvastatin  20 mg Oral QPC supper  . sodium chloride  1,000 mL Intravenous Once  . sodium chloride  1,000 mL Intravenous Once  . Travoprost (BAK Free)  1 drop Both Eyes QHS    Assessment/Plan Principal Problem:  *Lower GI bleed Active Problems:  Hypotension  Chronic kidney disease, stage III (moderate)  Hypertension  Hypothyroidism  History of stroke  1. Hypotension - Secondary to GIB. Responding to IVF. Patient to be transfused 2 units PRBCs. Follow. Continue IVF. 2. GIB -  Likely secondary to LGIB secondary to diverticular bleed. Patient with 2 large bloddy BM. Patient with drop in Hgb 14 to 12. Orders for transfusion already placed per GI. Tagged RBC scan pending. Continue serial CBC. Plavix on hold. Gi ff and appreciate input and rxcs. 3. CKD stage III- sTABLE RENAL FUNCTION IMPROVED . FOLLOW AND MONITOR FOR VOLUME OVERLOAD. 4. HTN- BP meds on hold. 5. Hx CVA - Plavix and ASA on hold secondary to #2. GI to determine when to resume. 6.Hypothyroidism - Synthroid. 7. Prophylaxis- SCDs for DVT.    LOS: 1 day   [facility] 02/18/2011, 8:43 AM

## 2011-02-18 NOTE — Progress Notes (Signed)
Tagged RBC pool scan  Was positive in transverse colon, c/w active bleeding. I talked to Dr Bonnielee Haff in the afternoon concerning  Mesenteric arteriogram, but,  by that time, pt stopped bleeding. After 2 units of pRBC's Hgb 10.4, pt is still mildly hypotensive. Look for other causes of hypotension.

## 2011-02-19 ENCOUNTER — Inpatient Hospital Stay (HOSPITAL_COMMUNITY): Payer: Medicare (Managed Care)

## 2011-02-19 ENCOUNTER — Encounter (HOSPITAL_COMMUNITY): Payer: Self-pay | Admitting: Emergency Medicine

## 2011-02-19 ENCOUNTER — Encounter (HOSPITAL_COMMUNITY)
Admission: EM | Disposition: A | Discharge status: Home / Self Care | Payer: Self-pay | Source: Home / Self Care | Attending: Internal Medicine

## 2011-02-19 DIAGNOSIS — D72829 Elevated white blood cell count, unspecified: Secondary | ICD-10-CM

## 2011-02-19 DIAGNOSIS — K921 Melena: Secondary | ICD-10-CM

## 2011-02-19 DIAGNOSIS — D6489 Other specified anemias: Secondary | ICD-10-CM

## 2011-02-19 HISTORY — PX: ESOPHAGOGASTRODUODENOSCOPY: SHX5428

## 2011-02-19 HISTORY — PX: FLEXIBLE SIGMOIDOSCOPY: SHX5431

## 2011-02-19 HISTORY — DX: Elevated white blood cell count, unspecified: D72.829

## 2011-02-19 LAB — CBC
HCT: 24.8 % — ABNORMAL LOW (ref 39.0–52.0)
HCT: 25.5 % — ABNORMAL LOW (ref 39.0–52.0)
HCT: 26.6 % — ABNORMAL LOW (ref 39.0–52.0)
Hemoglobin: 9.2 g/dL — ABNORMAL LOW (ref 13.0–17.0)
Hemoglobin: 8.5 g/dL — ABNORMAL LOW (ref 13.0–17.0)
MCH: 31.2 pg (ref 26.0–34.0)
MCHC: 34.3 g/dL (ref 30.0–36.0)
MCV: 90.2 fL (ref 78.0–100.0)
MCV: 90.2 fL (ref 78.0–100.0)
MCV: 87.2 fL (ref 78.0–100.0)
Platelets: 118 10*3/uL — ABNORMAL LOW (ref 150–400)
Platelets: 149 10*3/uL — ABNORMAL LOW (ref 150–400)
Platelets: 175 10*3/uL (ref 150–400)
RBC: 2.95 MIL/uL — ABNORMAL LOW (ref 4.22–5.81)
RBC: 3.05 MIL/uL — ABNORMAL LOW (ref 4.22–5.81)
RDW: 14 % (ref 11.5–15.5)
RDW: 14.4 % (ref 11.5–15.5)
WBC: 15.2 10*3/uL — ABNORMAL HIGH (ref 4.0–10.5)
WBC: 16.2 10*3/uL — ABNORMAL HIGH (ref 4.0–10.5)
WBC: 18.9 10*3/uL — ABNORMAL HIGH (ref 4.0–10.5)

## 2011-02-19 LAB — PREPARE RBC (CROSSMATCH)

## 2011-02-19 LAB — URINALYSIS, ROUTINE W REFLEX MICROSCOPIC
Glucose, UA: NEGATIVE mg/dL
Hgb urine dipstick: NEGATIVE
Ketones, ur: NEGATIVE mg/dL
Leukocytes, UA: NEGATIVE
pH: 5 (ref 5.0–8.0)

## 2011-02-19 LAB — CARDIAC PANEL(CRET KIN+CKTOT+MB+TROPI)
CK, MB: 4.2 ng/mL — ABNORMAL HIGH (ref 0.3–4.0)
CK, MB: 4.3 ng/mL — ABNORMAL HIGH (ref 0.3–4.0)
Relative Index: 3.5 — ABNORMAL HIGH (ref 0.0–2.5)
Relative Index: INVALID (ref 0.0–2.5)
Total CK: 119 U/L (ref 7–232)
Troponin I: <0.3 ng/mL (ref <0.30)

## 2011-02-19 LAB — BASIC METABOLIC PANEL
BUN: 29 mg/dL — ABNORMAL HIGH (ref 6–23)
Chloride: 113 mEq/L — ABNORMAL HIGH (ref 96–112)
GFR calc Af Amer: 48 mL/min — ABNORMAL LOW (ref >90)
GFR calc non Af Amer: 42 mL/min — ABNORMAL LOW (ref >90)
Potassium: 4 mEq/L (ref 3.5–5.1)
Sodium: 140 mEq/L (ref 135–145)

## 2011-02-19 LAB — LACTIC ACID, PLASMA: Lactic Acid, Venous: 1.7 mmol/L (ref 0.5–2.2)

## 2011-02-19 LAB — PROCALCITONIN: Procalcitonin: <0.1 ng/mL

## 2011-02-19 SURGERY — EGD (ESOPHAGOGASTRODUODENOSCOPY)
Anesthesia: Moderate Sedation

## 2011-02-19 MED ORDER — BIOTENE DRY MOUTH MT LIQD
15.0000 mL | Freq: Two times a day (BID) | OROMUCOSAL | Status: DC
  Administered 2011-02-19 – 2011-02-25 (×10): 15 mL via OROMUCOSAL

## 2011-02-19 MED ORDER — MIDAZOLAM HCL 10 MG/2ML IJ SOLN
INTRAMUSCULAR | Status: DC | PRN
  Administered 2011-02-19 – 2011-02-21 (×4): 1 mg via INTRAVENOUS
  Administered 2011-02-21 (×2): 2 mg via INTRAVENOUS

## 2011-02-19 MED ORDER — LIDOCAINE VISCOUS 2 % MT SOLN
OROMUCOSAL | Status: AC
  Filled 2011-02-19: qty 15

## 2011-02-19 MED ORDER — DIPHENHYDRAMINE HCL 50 MG/ML IJ SOLN
INTRAMUSCULAR | Status: AC
  Filled 2011-02-19: qty 1

## 2011-02-19 MED ORDER — PANTOPRAZOLE SODIUM 40 MG PO TBEC
40.0000 mg | DELAYED_RELEASE_TABLET | Freq: Every day | ORAL | Status: DC
  Filled 2011-02-19: qty 1

## 2011-02-19 MED ORDER — FENTANYL CITRATE 0.05 MG/ML IJ SOLN
INTRAMUSCULAR | Status: AC
  Filled 2011-02-19: qty 4

## 2011-02-19 MED ORDER — MIDAZOLAM HCL 10 MG/2ML IJ SOLN
INTRAMUSCULAR | Status: AC
  Filled 2011-02-19: qty 2

## 2011-02-19 MED ORDER — LIDOCAINE VISCOUS 2 % MT SOLN
OROMUCOSAL | Status: DC | PRN
  Administered 2011-02-19: 1 via OROMUCOSAL

## 2011-02-19 MED ORDER — FENTANYL NICU IV SYRINGE 50 MCG/ML
INJECTION | INTRAMUSCULAR | Status: DC | PRN
  Administered 2011-02-19: 12.5 ug via INTRAVENOUS
  Administered 2011-02-21 (×2): 25 ug via INTRAVENOUS

## 2011-02-19 MED ORDER — SODIUM CHLORIDE 0.9 % IV BOLUS (SEPSIS)
500.0000 mL | Freq: Once | INTRAVENOUS | Status: AC
  Administered 2011-02-19: 500 mL via INTRAVENOUS

## 2011-02-19 MED ORDER — MIDAZOLAM HCL 10 MG/2ML IJ SOLN
INTRAMUSCULAR | Status: AC
  Filled 2011-02-19: qty 4

## 2011-02-19 NOTE — Progress Notes (Signed)
Subjective: Patient has had ongoing rectal bleeding since admission but denies having any nausea, vomiting, abdominal pain, dysphagia or odynophagia. As per my discussion with his family, who are at bedside, plans are to proceed with an EGD/Flexible sigmoidoscopy to see if we can localize a source of the blood loss. Alternatives for diagnostic procedures discussed with the patient and the family as well.   Objective: Vital signs in last 24 hours: Temp:  [97.9 F (36.6 C)-98.6 F (37 C)] 98 F (36.7 C) (11/23 1616) Pulse Rate:  [73-118] 89  (11/23 1634) Resp:  [10-20] 13  (11/23 1634) BP: (76-121)/(46-84) 103/72 mmHg (11/23 1616) SpO2:  [96 %-100 %] 100 % (11/23 1634) Weight:  [88.1 kg (194 lb 3.6 oz)] 194 lb 3.6 oz (88.1 kg) (11/23 0315) Last BM Date: 02/19/11  Intake/Output from previous day: 11/22 0701 - 11/23 0700 In: 5885 [P.O.:590; I.V.:4050; Blood:1200] Out: 1145 [Urine:320; Stool:825] Intake/Output this shift: Total I/O In: 1357.5 [I.V.:1220; Blood:137.5] Out: 925 [Urine:775; Stool:150] GPE: Alert, oriented x 3 in NAD Neck: supple Chest : CTA S1 and S2 regular Abdomen: Obese, hyperactive BS.   Lab Results:  Basename 02/19/11 0940 02/19/11 0545 02/19/11 0012  WBC 15.7* 16.2* 18.9*  HGB 8.5* 9.2* 8.9*  HCT 24.8* 26.6* 25.5*  PLT 145* 149* 175   BMET  Basename 02/19/11 0545 02/18/11 0542 02/17/11 1610  NA 140 138 141  K 4.0 4.0 4.0  CL 113* 109 104  CO2 20 23 28   GLUCOSE 111* 104* 134*  BUN 29* 22 25*  CREATININE 1.64* 1.37* 2.05*  CALCIUM 7.1* 9.1 10.8*  LFT Basename 02/17/11 1610  PROT 6.6  ALBUMIN 4.0  AST 19  ALT 29  ALKPHOS 47  BILITOT 0.5  BILIDIR --  IBILI --  PT/INR Basename 02/17/11 1610  LABPROT 13.5  INR 1.01   Studies/Results: Nm Gi Blood Loss  02/18/2011  *RADIOLOGY REPORT*  Clinical Data: GI bleeding.  Hypotension.  Decreased hemoglobin.  NUCLEAR MEDICINE GASTROINTESTINAL BLEEDING STUDY  Technique:  Sequential abdominal images were  obtained following intravenous administration of Tc-59m labeled red blood cells.  Radiopharmaceutical: 23.4 mCi of technetium 3m labeled red blood cells.  Comparison: None.  Findings: Active GI bleeding is seen initially on the 5-minute image originating in the midportion of the transverse colon. Subsequent tracking of radiopharmaceutical activity to the splenic flexure is seen by the 60-minute image.  IMPRESSION: Positive for active GI bleeding originating in the midportion of the transverse colon.  Original Report Authenticated By: Danae Orleans, M.D.   Dg Chest Port 1 View  02/19/2011  *RADIOLOGY REPORT*  Clinical Data: Central line placement.  PORTABLE CHEST - 1 VIEW  Comparison: Chest 02/19/2011 8:52 a.m.  Findings: The patient has a new left IJ catheter with the tip in the lower superior vena cava.  No pneumothorax.  Lungs are clear. Heart size is normal.  No pleural fluid.  IMPRESSION: Left IJ catheter in good position.  No pneumothorax or acute finding.  Original Report Authenticated By: Bernadene Bell. Maricela Curet, M.D.   Dg Chest Port 1 View  02/19/2011  *RADIOLOGY REPORT*  Clinical Data: Leukocytosis.  GI bleed.  Hypotension.  PORTABLE CHEST - 1 VIEW  Comparison: None.  Findings: Heart size and vascularity are normal and the lungs are clear.  No osseous abnormality.  IMPRESSION: Normal chest.  Original Report Authenticated By: Gwynn Burly, M.D.    Medications: I have reviewed the patient's current medications.  Assessment/Plan:  Rectal bleeding with severe anemia: Proceed with  an EGD and a flexible sigmoidoscopy at this time.  LOS: 2 days   Jatin Naumann 02/19/2011, 4:35 PM

## 2011-02-19 NOTE — Progress Notes (Signed)
Subjective: The patient is being seen in followup in the ICU with regards to ongoing rectal bleeding; his 2 daughters and his wife at the bedside as per my discussion with the nurse the patient had ongoing rectal bleeding through the rectal tube reportedly he's had 150 cc of bright red blood since birth per rectum since 4:00 AM  this morning. Patient denies any nausea vomiting abdominal pain fever chills or riders he is very anxious to get some food or liquids as he is thirsty and hungry since admission. He has not had any thing to eat or drink since admission. As per his wife his last colonoscopy was about a year and a half ago and she thinks it was reportedly normal patient slightly for the patient are not sure about the findings of the colonoscopy they are not aware of our the existence of diverticular disease patient has been on 75 mg of Plavix and 325 mg of aspirin since his stroke in 2008 and he's continued to increase anticoagulants till the day of admission. Objective: Vital signs in last 24 hours: Temp:  [97.6 F (36.4 C)-98.6 F (37 C)] 97.9 F (36.6 C) (11/23 0700) Pulse Rate:  [76-118] 76  (11/23 1100) Resp:  [10-20] 12  (11/23 1100) BP: (76-121)/(44-76) 121/72 mmHg (11/23 1100) SpO2:  [96 %-100 %] 100 % (11/23 1100) Weight:  [88.1 kg (194 lb 3.6 oz)] 194 lb 3.6 oz (88.1 kg) (11/23 0315) Last BM Date: 02/19/11  Intake/Output from previous day: 11/22 0701 - 11/23 0700 In: 5885 [P.O.:590; I.V.:4050; Blood:1200] Out: 1145 [Urine:320; Stool:825] Intake/Output this shift: Total I/O In: -  Out: 300 [Urine:300]  General appearance: cooperative, appears stated age, no distress and mildly obese Neck: Supple  Chest : CTA S1 and S2 regular.  Abdomen: Soft, obese with hyperactive BS.  Lab Results:  Basename 02/19/11 0940 02/19/11 0545 02/19/11 0012  WBC 15.7* 16.2* 18.9*  HGB 8.5* 9.2* 8.9*  HCT 24.8* 26.6* 25.5*  PLT 145* 149* 175  BMET Basename 02/19/11 0545 02/18/11 0542  02/17/11 1610  NA 140 138 141  K 4.0 4.0 4.0  CL 113* 109 104  CO2 20 23 28   GLUCOSE 111* 104* 134*  BUN 29* 22 25*  CREATININE 1.64* 1.37* 2.05*  CALCIUM 7.1* 9.1 10.8*  LFT Basename 02/17/11 1610  PROT 6.6  ALBUMIN 4.0  AST 19  ALT 29  ALKPHOS 47  BILITOT 0.5  BILIDIR --  IBILI --   PT/INR  Basename 02/17/11 1610  LABPROT 13.5  INR 1.01   Studies/Results: Nm Gi Blood Loss  02/18/2011  *RADIOLOGY REPORT*  Clinical Data: GI bleeding.  Hypotension.  Decreased hemoglobin.  NUCLEAR MEDICINE GASTROINTESTINAL BLEEDING STUDY  Technique:  Sequential abdominal images were obtained following intravenous administration of Tc-72m labeled red blood cells.  Radiopharmaceutical: 23.4 mCi of technetium 43m labeled red blood cells.  Comparison: None.  Findings: Active GI bleeding is seen initially on the 5-minute image originating in the midportion of the transverse colon. Subsequent tracking of radiopharmaceutical activity to the splenic flexure is seen by the 60-minute image.  IMPRESSION: Positive for active GI bleeding originating in the midportion of the transverse colon.  Original Report Authenticated By: Danae Orleans, M.D.   Dg Chest Port 1 View  02/19/2011  *RADIOLOGY REPORT*  Clinical Data: Central line placement.  PORTABLE CHEST - 1 VIEW  Comparison: Chest 02/19/2011 8:52 a.m.  Findings: The patient has a new left IJ catheter with the tip in the lower superior vena cava.  No pneumothorax.  Lungs are clear. Heart size is normal.  No pleural fluid.  IMPRESSION: Left IJ catheter in good position.  No pneumothorax or acute finding.  Original Report Authenticated By: Bernadene Bell. Maricela Curet, M.D.   Dg Chest Port 1 View  02/19/2011  *RADIOLOGY REPORT*  Clinical Data: Leukocytosis.  GI bleed.  Hypotension.  PORTABLE CHEST - 1 VIEW  Comparison: None.  Findings: Heart size and vascularity are normal and the lungs are clear.  No osseous abnormality.  IMPRESSION: Normal chest.  Original Report  Authenticated By: Gwynn Burly, M.D.   Medications: I have reviewed the patient's current medications.  Assessment/Plan:  Rectal bleeding with anemia: Source of bleeding is unclear; plan is to transfuse a unit patient with 2 units of packed red blood cells and call interventional radiology for a possible arteriogram was started on that is available within the next half hour or so if he does not feel that an angiogram would be of much help at this time an EGD with a possible flexible sigmoidoscopy might be helpful. Continue serial CBC's at this time and keep the patient on ice chips only.  Chronic renal insufficiency monitor BUN and creatinine closely.  Hypothyroidism on thyroid supplements.  History of the stroke in 2008 on aspirin and Plavix since then.  . LOS: 2 days   Edynn Gillock 02/19/2011, 2:53 PM

## 2011-02-19 NOTE — Progress Notes (Signed)
UR CHART REVIEWED 

## 2011-02-19 NOTE — Progress Notes (Signed)
Subjective: Patient with bright red blood per rectum overnight per nursing. Patient with flexseal  with bright right blood approximately 250 cc. Patient has no other complaints. Patient was transfused one unit of packed red blood cells overnight.  Objective: Vital signs in last 24 hours: Filed Vitals:   02/19/11 0315 02/19/11 0415 02/19/11 0500 02/19/11 0700  BP: 91/46 106/70  113/73  Pulse: 102 92  84  Temp: 98.6 F (37 C) 98.1 F (36.7 C) 98.2 F (36.8 C) 97.9 F (36.6 C)  TempSrc: Oral Oral Oral Oral  Resp: 12 12  14   Height:      Weight: 88.1 kg (194 lb 3.6 oz)     SpO2:  98%  98%    Intake/Output Summary (Last 24 hours) at 02/19/11 0830 Last data filed at 02/19/11 0700  Gross per 24 hour  Intake   5735 ml  Output    495 ml  Net   5240 ml    Weight change: 2.5 kg (5 lb 8.2 oz)  General: Alert, awake, oriented x3, in no acute distress. Heart: Regular, rate, rhythm with no murmurs, rubs, gallops. Lungs: Clear to auscultation bilaterally. Abdomen: Soft, nontender, nondistended, positive bowel sounds, , suprapubic discomfort. Extremities: No clubbing cyanosis or edema with positive pedal pulses. Neuro: Grossly intact, nonfocal.   Lab Results:  [facility] 02/19/11 0545 02/18/11 0542  NA 140 138  K 4.0 4.0  CL 113* 109  CO2 20 23  GLUCOSE 111* 104*  BUN 29* 22  CREATININE 1.64* 1.37*  CALCIUM 7.1* 9.1  MG -- --  PHOS -- --    Basename 02/17/11 1610  AST 19  ALT 29  ALKPHOS 47  BILITOT 0.5  PROT 6.6  ALBUMIN 4.0   No results found for this basename: LIPASE:2,AMYLASE:2 in the last 72 hours  Basename 02/19/11 0545 02/19/11 0012  WBC 16.2* 18.9*  NEUTROABS -- --  HGB 9.2* 8.9*  HCT 26.6* 25.5*  MCV 90.2 90.4  PLT 149* 175   No results found for this basename: CKTOTAL:3,CKMB:3,CKMBINDEX:3,TROPONINI:3 in the last 72 hours No results found for this basename: POCBNP:3 in the last 72 hours No results found for this basename: DDIMER:2 in the last 72  hours No results found for this basename: HGBA1C:2 in the last 72 hours No results found for this basename: CHOL:2,HDL:2,LDLCALC:2,TRIG:2,CHOLHDL:2,LDLDIRECT:2 in the last 72 hours No results found for this basename: TSH,T4TOTAL,FREET3,T3FREE,THYROIDAB in the last 72 hours No results found for this basename: VITAMINB12:2,FOLATE:2,FERRITIN:2,TIBC:2,IRON:2,RETICCTPCT:2 in the last 72 hours  Micro Results: Recent Results (from the past 240 hour(s))  MRSA PCR SCREENING     Status: Normal   Collection Time   02/18/11 12:42 AM      Component Value Range Status Comment   MRSA by PCR NEGATIVE  NEGATIVE  Final     Studies/Results: Nm Gi Blood Loss  02/18/2011  *RADIOLOGY REPORT*  Clinical Data: GI bleeding.  Hypotension.  Decreased hemoglobin.  NUCLEAR MEDICINE GASTROINTESTINAL BLEEDING STUDY  Technique:  Sequential abdominal images were obtained following intravenous administration of Tc-60m labeled red blood cells.  Radiopharmaceutical: 23.4 mCi of technetium 55m labeled red blood cells.  Comparison: None.  Findings: Active GI bleeding is seen initially on the 5-minute image originating in the midportion of the transverse colon. Subsequent tracking of radiopharmaceutical activity to the splenic flexure is seen by the 60-minute image.  IMPRESSION: Positive for active GI bleeding originating in the midportion of the transverse colon.  Original Report Authenticated By: Danae Orleans, M.D.  Medications:    . dextran 70-hypromellose  1 drop Both Eyes BID  . DULoxetine  60 mg Oral BID  . levothyroxine  100 mcg Oral QHS  . polyvinyl alcohol  1 drop Both Eyes BID  . simvastatin  20 mg Oral QPC supper  . sodium chloride  1,000 mL Intravenous Once  . sodium chloride  500 mL Intravenous Once  . Travoprost (BAK Free)  1 drop Both Eyes QHS  . vitamin A & D      . DISCONTD: finasteride  5 mg Oral Daily    Assessment/Plan Principal Problem:  *Lower GI bleed Active Problems:  Hypotension   Leukocytosis  Chronic kidney disease, stage III (moderate)  Hypertension  Hypothyroidism  History of stroke  1. Hypotension - likely Secondary to GIB. Patient continues to have bright red blood per rectum. Systolic blood pressure this morning in the low 100s . Patient also with a leukocytosis. Will check blood cultures x2, check a UA with cultures and sensitivities, check a portable chest x-ray, check a lactic acid level, check a pro calcitonin level , cycle cardiac enzymes every 8 hours x3 to rule out a cardiogenic source . Responding to IVF and transfusion. Will hold patient's finasteride. Continue IVF. 2. GIB - Likely secondary to LGIB secondary to bleeding in the transverse colon per tagged red blood cell study.. Patient continues to have bleeding. Patient is status post 3 units packed red blood cells. Patient's  Hgb is 9.2 this morning. Continue to hold aspirin and Plavix. May need mesenteric arteriogram per GIs recommendations. Continue serial CBC. If further decrease in patient's hemoglobin will transfuse 2 units of packed red blood cells. Gi ff and appreciate input and rxcs. 3. CKD stage III- sTABLE RENAL FUNCTION IMPROVED . FOLLOW AND MONITOR FOR VOLUME OVERLOAD. 4. leukocytosis- will check a portable chest x-ray,  check a UA with cultures and sensitivities, will check blood cultures x2, will check a lactic acid level, will check a procalcitonin level, will monitor. 5. HTN- BP meds on hold. 6. Hx CVA - Plavix and ASA on hold secondary to #2. GI to determine when to resume. 7.Hypothyroidism - Synthroid. 8. Prophylaxis- SCDs for DVT. PPI for GI.    LOS: 2 days   Meleni Delahunt 02/19/2011, 8:30 AM

## 2011-02-19 NOTE — Procedures (Signed)
Central Venous Catheter Insertion Procedure Note Gregory Kline [id number] [birth year]  Procedure: Insertion of Central Venous Catheter Indications: Drug and/or fluid administration  Procedure Details Consent: Risks of procedure as well as the alternatives and risks of each were explained to the (patient/caregiver).  Consent for procedure obtained. Time Out: Verified patient identification, verified procedure, site/side was marked, verified correct patient position, special equipment/implants available, medications/allergies/relevent history reviewed, required imaging and test results available.  Performed  Maximum sterile technique was used including antiseptics, cap, gloves, gown, hand hygiene, mask and sheet. Skin prep: Chlorhexidine; local anesthetic administered A antimicrobial bonded/coated triple lumen catheter was placed in the left internal jugular vein using the Seldinger technique.  Evaluation Blood flow good Complications: No apparent complications Patient tolerate procedure well. Chest X-ray ordered to verify placement.  [id number]. Lt I J CVL placed to 20 cms, blood aspirated via all 3 ports, flushed x 3, sutured x 2 and dressing applied.    Placed by S Minor NP under by direct supervision  Sandrea Hughs, MD Pulmonary and Critical Care Medicine [facility] Cell [phone]

## 2011-02-20 ENCOUNTER — Inpatient Hospital Stay (HOSPITAL_COMMUNITY): Payer: Medicare (Managed Care)

## 2011-02-20 DIAGNOSIS — R933 Abnormal findings on diagnostic imaging of other parts of digestive tract: Secondary | ICD-10-CM

## 2011-02-20 DIAGNOSIS — D62 Acute posthemorrhagic anemia: Secondary | ICD-10-CM

## 2011-02-20 DIAGNOSIS — K922 Gastrointestinal hemorrhage, unspecified: Secondary | ICD-10-CM

## 2011-02-20 DIAGNOSIS — K5791 Diverticulosis of intestine, part unspecified, without perforation or abscess with bleeding: Secondary | ICD-10-CM

## 2011-02-20 HISTORY — DX: Diverticulosis of intestine, part unspecified, without perforation or abscess with bleeding: K57.91

## 2011-02-20 LAB — CBC
HCT: 24.4 % — ABNORMAL LOW (ref 39.0–52.0)
HCT: 29.8 % — ABNORMAL LOW (ref 39.0–52.0)
Hemoglobin: 8.7 g/dL — ABNORMAL LOW (ref 13.0–17.0)
MCH: 29.7 pg (ref 26.0–34.0)
MCH: 29.9 pg (ref 26.0–34.0)
MCHC: 35.3 g/dL (ref 30.0–36.0)
MCHC: 35.6 g/dL (ref 30.0–36.0)
MCHC: 35.7 g/dL (ref 30.0–36.0)
MCV: 84.2 fL (ref 78.0–100.0)
Platelets: 118 10*3/uL — ABNORMAL LOW (ref 150–400)
Platelets: 123 10*3/uL — ABNORMAL LOW (ref 150–400)
RBC: 2.79 MIL/uL — ABNORMAL LOW (ref 4.22–5.81)
RDW: 16.5 % — ABNORMAL HIGH (ref 11.5–15.5)
RDW: 16.9 % — ABNORMAL HIGH (ref 11.5–15.5)
WBC: 13.8 10*3/uL — ABNORMAL HIGH (ref 4.0–10.5)

## 2011-02-20 LAB — CARDIAC PANEL(CRET KIN+CKTOT+MB+TROPI)
CK, MB: 4.1 ng/mL — ABNORMAL HIGH (ref 0.3–4.0)
CK, MB: 4.1 ng/mL — ABNORMAL HIGH (ref 0.3–4.0)
Total CK: 86 U/L (ref 7–232)
Total CK: 103 U/L (ref 7–232)

## 2011-02-20 LAB — BASIC METABOLIC PANEL
BUN: 16 mg/dL (ref 6–23)
CO2: 21 mEq/L (ref 19–32)
CO2: 22 mEq/L (ref 19–32)
Calcium: 6.9 mg/dL — ABNORMAL LOW (ref 8.4–10.5)
Chloride: 115 mEq/L — ABNORMAL HIGH (ref 96–112)
Chloride: 112 mEq/L (ref 96–112)
Creatinine, Ser: 1.15 mg/dL (ref 0.50–1.35)
GFR calc Af Amer: 69 mL/min — ABNORMAL LOW (ref >90)
Glucose, Bld: 95 mg/dL (ref 70–99)
Sodium: 141 mEq/L (ref 135–145)

## 2011-02-20 LAB — ALBUMIN: Albumin: 2.6 g/dL — ABNORMAL LOW (ref 3.5–5.2)

## 2011-02-20 LAB — MAGNESIUM: Magnesium: 2 mg/dL (ref 1.5–2.5)

## 2011-02-20 MED ORDER — TECHNETIUM TC 99M-LABELED RED BLOOD CELLS IV KIT
20.1000 | PACK | Freq: Once | INTRAVENOUS | Status: AC | PRN
  Administered 2011-02-20: 20.1 via INTRAVENOUS

## 2011-02-20 MED ORDER — POTASSIUM CHLORIDE 10 MEQ/100ML IV SOLN
INTRAVENOUS | Status: AC
  Administered 2011-02-20: 10 meq via INTRAVENOUS
  Filled 2011-02-20: qty 400

## 2011-02-20 MED ORDER — PEG-KCL-NACL-NASULF-NA ASC-C 100 G PO SOLR
1.0000 | Freq: Once | ORAL | Status: AC
  Administered 2011-02-20: 100 g via ORAL
  Filled 2011-02-20: qty 1

## 2011-02-20 MED ORDER — ACETAMINOPHEN 10 MG/ML IV SOLN
1000.0000 mg | Freq: Once | INTRAVENOUS | Status: AC
  Administered 2011-02-20: 1000 mg via INTRAVENOUS
  Filled 2011-02-20: qty 100

## 2011-02-20 MED ORDER — POTASSIUM CHLORIDE CRYS ER 20 MEQ PO TBCR: 40.0000 meq | EXTENDED_RELEASE_TABLET | Freq: Once | ORAL | Status: DC

## 2011-02-20 MED ORDER — POTASSIUM CHLORIDE 10 MEQ/100ML IV SOLN
10.0000 meq | INTRAVENOUS | Status: AC
  Administered 2011-02-20 (×4): 10 meq via INTRAVENOUS

## 2011-02-20 MED ORDER — PANTOPRAZOLE SODIUM 40 MG IV SOLR
40.0000 mg | Freq: Two times a day (BID) | INTRAVENOUS | Status: DC
  Administered 2011-02-20 – 2011-02-21 (×3): 40 mg via INTRAVENOUS
  Filled 2011-02-20 (×4): qty 40

## 2011-02-20 MED ORDER — SODIUM CHLORIDE 0.45 % IV SOLN
INTRAVENOUS | Status: DC
  Administered 2011-02-20 – 2011-02-22 (×5): via INTRAVENOUS

## 2011-02-20 MED ORDER — LORATADINE 10 MG PO TABS
10.0000 mg | ORAL_TABLET | Freq: Every day | ORAL | Status: DC
  Administered 2011-02-21 – 2011-02-25 (×5): 10 mg via ORAL
  Filled 2011-02-20 (×6): qty 1

## 2011-02-20 NOTE — Progress Notes (Signed)
Subjective: Patient with bright red blood per rectum overnight and blood noted as well in rectal pouch.per nursing. Patient has no other complaints.  Objective: Vital signs in last 24 hours: Filed Vitals:   02/20/11 0000 02/20/11 0400 02/20/11 0830 02/20/11 1025  BP: 124/72  100/72 108/71  Pulse: 95  92 95  Temp: 98.3 F (36.8 C) 102 F (38.9 C) 98.9 F (37.2 C) 98.2 F (36.8 C)  TempSrc: Oral Oral Oral Oral  Resp: 20  13 16   Height:      Weight:      SpO2: 98%  97%     Intake/Output Summary (Last 24 hours) at 02/20/11 1031 Last data filed at 02/20/11 0900  Gross per 24 hour  Intake   3715 ml  Output   1926 ml  Net   1789 ml    Weight change:   General: Alert, awake, oriented x3, in no acute distress. Heart: Regular, rate, rhythm with no murmurs, rubs, gallops. Lungs: Clear to auscultation bilaterally anterior lung fields. Abdomen: Soft, nontender, nondistended, positive bowel sounds, , suprapubic discomfort. Extremities: No clubbing cyanosis or edema with positive pedal pulses. Neuro: Grossly intact, nonfocal.   Lab Results:  Basename 02/20/11 0500 02/19/11 0545  NA 141 140  K 3.4* 4.0  CL 115* 113*  CO2 21 20  GLUCOSE 117* 111*  BUN 19 29*  CREATININE 1.22 1.64*  CALCIUM 6.9* 7.1*  MG -- --  PHOS -- --    Basename 02/17/11 1610  AST 19  ALT 29  ALKPHOS 47  BILITOT 0.5  PROT 6.6  ALBUMIN 4.0   No results found for this basename: LIPASE:2,AMYLASE:2 in the last 72 hours  Basename 02/20/11 0500 02/19/11 2215  WBC 14.8* 15.2*  NEUTROABS -- --  HGB 8.7* 9.4*  HCT 24.4* 26.6*  MCV 87.5 87.2  PLT 130* 118*    Basename 02/20/11 0500 02/19/11 2215 02/19/11 0940  CKTOTAL 86 99 119  CKMB 4.1* 4.3* 4.2*  CKMBINDEX -- -- --  TROPONINI <0.30 <0.30 <0.30   No results found for this basename: POCBNP:3 in the last 72 hours No results found for this basename: DDIMER:2 in the last 72 hours No results found for this basename: HGBA1C:2 in the last 72  hours No results found for this basename: CHOL:2,HDL:2,LDLCALC:2,TRIG:2,CHOLHDL:2,LDLDIRECT:2 in the last 72 hours No results found for this basename: TSH,T4TOTAL,FREET3,T3FREE,THYROIDAB in the last 72 hours No results found for this basename: VITAMINB12:2,FOLATE:2,FERRITIN:2,TIBC:2,IRON:2,RETICCTPCT:2 in the last 72 hours  Micro Results: Recent Results (from the past 240 hour(s))  MRSA PCR SCREENING     Status: Normal   Collection Time   02/18/11 12:42 AM      Component Value Range Status Comment   MRSA by PCR NEGATIVE  NEGATIVE  Final   CULTURE, BLOOD (ROUTINE X 2)     Status: Normal (Preliminary result)   Collection Time   02/19/11  9:40 AM      Component Value Range Status Comment   Specimen Description BLOOD RIGHT ARM   Final    Special Requests NONE [facility] EACH   Final    Setup Time [id number]   Final    Culture     Final    Value:        BLOOD CULTURE RECEIVED NO GROWTH TO DATE CULTURE WILL BE HELD FOR 5 DAYS BEFORE ISSUING A FINAL NEGATIVE REPORT   Report Status PENDING   Incomplete   CULTURE, BLOOD (ROUTINE X 2)     Status: Normal (Preliminary result)  Collection Time   02/19/11  9:47 AM      Component Value Range Status Comment   Specimen Description BLOOD RIGHT HAND   Final    Special Requests NONE BOTTLES DRAWN AEROBIC ONLY 1CC   Final    Setup Time [id number]   Final    Culture     Final    Value:        BLOOD CULTURE RECEIVED NO GROWTH TO DATE CULTURE WILL BE HELD FOR 5 DAYS BEFORE ISSUING A FINAL NEGATIVE REPORT   Report Status PENDING   Incomplete     Studies/Results: Nm Gi Blood Loss  02/18/2011  *RADIOLOGY REPORT*  Clinical Data: GI bleeding.  Hypotension.  Decreased hemoglobin.  NUCLEAR MEDICINE GASTROINTESTINAL BLEEDING STUDY  Technique:  Sequential abdominal images were obtained following intravenous administration of Tc-70m labeled red blood cells.  Radiopharmaceutical: 23.4 mCi of technetium 33m labeled red blood cells.  Comparison: None.  Findings:  Active GI bleeding is seen initially on the 5-minute image originating in the midportion of the transverse colon. Subsequent tracking of radiopharmaceutical activity to the splenic flexure is seen by the 60-minute image.  IMPRESSION: Positive for active GI bleeding originating in the midportion of the transverse colon.  Original Report Authenticated By: Danae Orleans, M.D.   Dg Chest Port 1 View  02/19/2011  *RADIOLOGY REPORT*  Clinical Data: Central line placement.  PORTABLE CHEST - 1 VIEW  Comparison: Chest 02/19/2011 8:52 a.m.  Findings: The patient has a new left IJ catheter with the tip in the lower superior vena cava.  No pneumothorax.  Lungs are clear. Heart size is normal.  No pleural fluid.  IMPRESSION: Left IJ catheter in good position.  No pneumothorax or acute finding.  Original Report Authenticated By: Bernadene Bell. Maricela Curet, M.D.   Dg Chest Port 1 View  02/19/2011  *RADIOLOGY REPORT*  Clinical Data: Leukocytosis.  GI bleed.  Hypotension.  PORTABLE CHEST - 1 VIEW  Comparison: None.  Findings: Heart size and vascularity are normal and the lungs are clear.  No osseous abnormality.  IMPRESSION: Normal chest.  Original Report Authenticated By: Gwynn Burly, M.D.    Medications:    . acetaminophen  1,000 mg Intravenous Once  . antiseptic oral rinse  15 mL Mouth Rinse BID  . DULoxetine  60 mg Oral BID  . levothyroxine  100 mcg Oral QHS  . pantoprazole (PROTONIX) IV  40 mg Intravenous Q12H  . polyvinyl alcohol  1 drop Both Eyes BID  . potassium chloride  10 mEq Intravenous Q1 Hr x 4  . simvastatin  20 mg Oral QPC supper  . Travoprost (BAK Free)  1 drop Both Eyes QHS  . DISCONTD: dextran 70-hypromellose  1 drop Both Eyes BID  . DISCONTD: pantoprazole  40 mg Oral Q0600  . DISCONTD: potassium chloride  40 mEq Oral Once    Assessment/Plan Principal Problem:  *Lower GI bleed Active Problems:  Hypotension  Leukocytosis  Chronic kidney disease, stage III (moderate)  Hypertension   Hypothyroidism  History of stroke Hypocalcemia  1. Hypotension - likely Secondary to GIB. Patient continues to have bright red blood per rectum. Systolic blood pressure this morning in the low 100s . Patient also with a leukocytosis.  blood cultures x2 pending. UA negative. Chest x-ray negative., lactic acid level WNL,  procalcitonin level negative , cardiac enzymes negative x3 . Responding to IVF and blood  transfusions. Will hold patient's finasteride. Continue IVF.Follow. 2. GIB - Likely secondary to LGIB secondary  to bleeding in the transverse colon per tagged red blood cell study. Patient continues to have bleeding. Patient is status post 5 units packed red blood cells. Patient's  Hgb is 8.7 this morning. Continue to hold aspirin and Plavix. Patient is s/p EGD which was negative, and flex sig was unable to se viewed secondary to blood in colon. Needs mesenteric arteriogram per GIs recommendations. Patient to be transfused 2u PRBCs.Continue serial CBC. Gi ff and appreciate input and rxcs. 3. CKD stage III- sTABLE RENAL FUNCTION IMPROVED . FOLLOW AND MONITOR FOR VOLUME OVERLOAD. 4. leukocytosis- Chest x-ray negative,  UA with cultures and sensitivities negative. Blood cultures pending.lactic acid level WNL,  procalcitonin level WNL. WBC trending down. Follow 5. HTN- BP meds on hold. 6. Hx CVA - Plavix and ASA on hold secondary to #2. GI to determine when to resume. 7.Hypothyroidism - Synthroid. 8. Hypocalcemia -  Maybe sec to CKD vs transfusions.check a magnesium, check a ionized calcium, check albumin, check phosphorus, check a vit d levels, PTH. Follow. 8. Prophylaxis- SCDs for DVT. PPI for GI.    LOS: 3 days   THOMPSON,DANIEL 02/20/2011, 10:31 AM

## 2011-02-20 NOTE — Progress Notes (Signed)
RBC pool scan this morning negative for active bleeding. No plans for mesenteric arteriogram at this time. Pt receiving 6th unit of blood,  I have discussed the condition with [name]. We will plan to prep pt with Movi prep today and do colonoscopy tomorrow.

## 2011-02-20 NOTE — Progress Notes (Signed)
Subjective Continues to mass frequent boody stools, Hgb has dropped from 9.4 to 8.7   Objective: Vital signs in last 24 hours: Temp:  [97.7 F (36.5 C)-102 F (38.9 C)] 102 F (38.9 C) (11/24 0400) Pulse Rate:  [73-110] 95  (11/24 0000) Resp:  [12-22] 20  (11/24 0000) BP: (87-139)/(54-84) 124/72 mmHg (11/24 0000) SpO2:  [98 %-100 %] 98 % (11/24 0000) Last BM Date: 02/19/11 General:   Alert,  pleasant, cooperative in NAD Head:  Normocephalic and atraumatic. Eyes:  Sclera clear, no icterus.   Conjunctiva pink. Mouth:  No deformity or lesions, dentition normal. Neck:  Supple; no masses or thyromegaly. Heart:  Regular rate and rhythm; no murmurs, clicks, rubs,  or gallops. Lungs:  No wheezes or rales Abdomen:  Soft, nontender, nl B.S.s  Msk:  Symmetrical without gross deformities. Normal posture. Pulses:  Normal pulses noted. Extremities:  Without clubbing or edema. Neurologic:  Alert and  oriented x4;  grossly normal neurologically. Skin:  Intact without significant lesions or rashes.  Intake/Output from previous day: 11/23 0701 - 11/24 0700 In: 4065 [I.V.:3070; Blood:950] Out: 2051 [Urine:1550; Stool:501] Intake/Output this shift:    Lab Results:  Basename 02/20/11 0500 02/19/11 2215 02/19/11 0940  WBC 14.8* 15.2* 15.7*  HGB 8.7* 9.4* 8.5*  HCT 24.4* 26.6* 24.8*  PLT 130* 118* 145*   BMET  Basename 02/20/11 0500 02/19/11 0545 02/18/11 0542  NA 141 140 138  K 3.4* 4.0 4.0  CL 115* 113* 109  CO2 21 20 23   GLUCOSE 117* 111* 104*  BUN 19 29* 22  CREATININE 1.22 1.64* 1.37*  CALCIUM 6.9* 7.1* 9.1   LFT  Basename 02/17/11 1610  PROT 6.6  ALBUMIN 4.0  AST 19  ALT 29  ALKPHOS 47  BILITOT 0.5  BILIDIR --  IBILI --   PT/INR  Basename 02/17/11 1610  LABPROT 13.5  INR 1.01   Hepatitis Panel No results found for this basename: HEPBSAG,HCVAB,HEPAIGM,HEPBIGM in the last 72 hours  Studies/Results: Nm Gi Blood Loss  02/18/2011  *RADIOLOGY REPORT*  Clinical  Data: GI bleeding.  Hypotension.  Decreased hemoglobin.  NUCLEAR MEDICINE GASTROINTESTINAL BLEEDING STUDY  Technique:  Sequential abdominal images were obtained following intravenous administration of Tc-20m labeled red blood cells.  Radiopharmaceutical: 23.4 mCi of technetium 19m labeled red blood cells.  Comparison: None.  Findings: Active GI bleeding is seen initially on the 5-minute image originating in the midportion of the transverse colon. Subsequent tracking of radiopharmaceutical activity to the splenic flexure is seen by the 60-minute image.  IMPRESSION: Positive for active GI bleeding originating in the midportion of the transverse colon.  Original Report Authenticated By: Danae Orleans, M.D.   Dg Chest Port 1 View  02/19/2011  *RADIOLOGY REPORT*  Clinical Data: Central line placement.  PORTABLE CHEST - 1 VIEW  Comparison: Chest 02/19/2011 8:52 a.m.  Findings: The patient has a new left IJ catheter with the tip in the lower superior vena cava.  No pneumothorax.  Lungs are clear. Heart size is normal.  No pleural fluid.  IMPRESSION: Left IJ catheter in good position.  No pneumothorax or acute finding.  Original Report Authenticated By: Bernadene Bell. Maricela Curet, M.D.   Dg Chest Port 1 View  02/19/2011  *RADIOLOGY REPORT*  Clinical Data: Leukocytosis.  GI bleed.  Hypotension.  PORTABLE CHEST - 1 VIEW  Comparison: None.  Findings: Heart size and vascularity are normal and the lungs are clear.  No osseous abnormality.  IMPRESSION: Normal chest.  Original Report Authenticated  By: Gwynn Burly, M.D.     ASSESSMENT:   Principal Problem:  *Lower GI bleed Active Problems:  Chronic kidney disease, stage III (moderate)  Hypertension  Hypothyroidism  History of stroke  Hypotension  Leukocytosis  Continues GIB , positive  RBC scan 2 days ago, I have spoken  To Dr Bonnielee Haff , Dr Fredia Sorrow and today Dr Deanne Coffer-   We need to proceed with the mesenteric arteriogram . It has been now 3 days since pt took  last Plavix.   PLAN:   Visceral angiogram ordered, call placed to Dr Corlis Leak, keep NPO, follow H/H K+ supplements     LOS: 3 days   Lina Sar  02/20/2011, 8:09 AM

## 2011-02-20 NOTE — Progress Notes (Signed)
I have spoken to Dr Rica Records concerning visceral angiogram and he suggests repeating the RBC scan  first since it has been 48 hours since the scan was positive. His Flex sigm by Dr Loreta Ave yesteday  Attempted but no visualization was accomplished due to blood in the colon

## 2011-02-21 ENCOUNTER — Encounter (HOSPITAL_COMMUNITY): Payer: Self-pay | Admitting: Gastroenterology

## 2011-02-21 ENCOUNTER — Encounter (HOSPITAL_COMMUNITY)
Admission: EM | Disposition: A | Discharge status: Home / Self Care | Payer: Self-pay | Source: Home / Self Care | Attending: Internal Medicine

## 2011-02-21 DIAGNOSIS — K922 Gastrointestinal hemorrhage, unspecified: Secondary | ICD-10-CM

## 2011-02-21 DIAGNOSIS — D62 Acute posthemorrhagic anemia: Secondary | ICD-10-CM

## 2011-02-21 DIAGNOSIS — D126 Benign neoplasm of colon, unspecified: Secondary | ICD-10-CM

## 2011-02-21 HISTORY — DX: Other disorders of phosphorus metabolism: E83.39

## 2011-02-21 HISTORY — PX: POLYPECTOMY: SHX5525

## 2011-02-21 HISTORY — PX: COLONOSCOPY: SHX5424

## 2011-02-21 LAB — COMPREHENSIVE METABOLIC PANEL
Albumin: 2.3 g/dL — ABNORMAL LOW (ref 3.5–5.2)
BUN: 12 mg/dL (ref 6–23)
Calcium: 7.6 mg/dL — ABNORMAL LOW (ref 8.4–10.5)
Chloride: 114 mEq/L — ABNORMAL HIGH (ref 96–112)
Creatinine, Ser: 1.16 mg/dL (ref 0.50–1.35)
GFR calc non Af Amer: 63 mL/min — ABNORMAL LOW (ref >90)
Total Bilirubin: 0.3 mg/dL (ref 0.3–1.2)

## 2011-02-21 LAB — CBC
HCT: 25.9 % — ABNORMAL LOW (ref 39.0–52.0)
HCT: 26.1 % — ABNORMAL LOW (ref 39.0–52.0)
HCT: 25.3 % — ABNORMAL LOW (ref 39.0–52.0)
Hemoglobin: 9 g/dL — ABNORMAL LOW (ref 13.0–17.0)
Hemoglobin: 9.2 g/dL — ABNORMAL LOW (ref 13.0–17.0)
MCH: 30 pg (ref 26.0–34.0)
MCH: 29.7 pg (ref 26.0–34.0)
MCHC: 34.4 g/dL (ref 30.0–36.0)
MCV: 85 fL (ref 78.0–100.0)
MCV: 86.3 fL (ref 78.0–100.0)
Platelets: 121 10*3/uL — ABNORMAL LOW (ref 150–400)
RBC: 3.04 MIL/uL — ABNORMAL LOW (ref 4.22–5.81)
RBC: 3.07 MIL/uL — ABNORMAL LOW (ref 4.22–5.81)
RDW: 17 % — ABNORMAL HIGH (ref 11.5–15.5)
WBC: 9.4 10*3/uL (ref 4.0–10.5)
WBC: 9.1 10*3/uL (ref 4.0–10.5)

## 2011-02-21 SURGERY — COLONOSCOPY
Anesthesia: Moderate Sedation

## 2011-02-21 MED ORDER — POTASSIUM PHOSPHATE DIBASIC 3 MMOLE/ML IV SOLN
10.0000 mmol | Freq: Once | INTRAVENOUS | Status: AC
  Administered 2011-02-21: 10 mmol via INTRAVENOUS
  Filled 2011-02-21: qty 3.33

## 2011-02-21 MED ORDER — POTASSIUM CHLORIDE CRYS ER 20 MEQ PO TBCR
40.0000 meq | EXTENDED_RELEASE_TABLET | Freq: Once | ORAL | Status: AC
  Administered 2011-02-21: 40 meq via ORAL
  Filled 2011-02-21: qty 2

## 2011-02-21 MED ORDER — MIDAZOLAM HCL 10 MG/2ML IJ SOLN
INTRAMUSCULAR | Status: AC
  Filled 2011-02-21: qty 2

## 2011-02-21 MED ORDER — PANTOPRAZOLE SODIUM 40 MG PO TBEC
40.0000 mg | DELAYED_RELEASE_TABLET | Freq: Every day | ORAL | Status: DC
  Administered 2011-02-22 – 2011-02-25 (×4): 40 mg via ORAL
  Filled 2011-02-21 (×6): qty 1

## 2011-02-21 MED ORDER — FENTANYL CITRATE 0.05 MG/ML IJ SOLN
INTRAMUSCULAR | Status: AC
  Filled 2011-02-21: qty 2

## 2011-02-21 NOTE — Brief Op Note (Signed)
Colonoscopy report: Excellent prep, no blood in the colon except for small  Amt of pink fluid around descending colon,  Many shallow divericula, particularly in the descending colon, non of them with bleeding signs Multiple polyps 90,110,130cm, and sessile polyp at 40 cm removed because pt will have to go back on Plavix. There is a large shallow ulcer in the anal canal from a Flaxiseal which was removed. Bleeding was likely diverticular

## 2011-02-21 NOTE — Plan of Care (Signed)
Problem: Phase II Progression Outcomes Goal: H&H stablized < 1gm drop in 24 hrs Outcome: Not Progressing H&H was trending up but now is going back down.

## 2011-02-21 NOTE — Progress Notes (Signed)
Subjective: Patient with bright red blood per rectum overnight, however rectal pouch now with brown stool. Repeat tagged RBC scan negative. Patient states he feels better. No complaints.  Objective: Vital signs in last 24 hours: Filed Vitals:   02/21/11 0400 02/21/11 0500 02/21/11 0700 02/21/11 0800  BP:  111/61 157/84   Pulse:  77 107   Temp: 98.3 F (36.8 C)   98 F (36.7 C)  TempSrc: Axillary   Oral  Resp:  13 22   Height:      Weight:      SpO2:  98% 99%     Intake/Output Summary (Last 24 hours) at 02/21/11 0851 Last data filed at 02/21/11 0801  Gross per 24 hour  Intake 3730.5 ml  Output   3952 ml  Net -221.5 ml    Weight change:   General: Alert, awake, oriented x3, in no acute distress. Heart: Regular, rate, rhythm with no murmurs, rubs, gallops. Lungs: Clear to auscultation bilaterally anterior lung fields. Abdomen: Soft, nontender, nondistended, positive bowel sounds, , suprapubic discomfort. Extremities: No clubbing cyanosis or edema with positive pedal pulses. Neuro: Grossly intact, nonfocal.   Lab Results:  Basename 02/21/11 0600 02/20/11 1845  NA 142 141  K 3.3* 3.5  CL 114* 112  CO2 22 22  GLUCOSE 101* 95  BUN 12 16  CREATININE 1.16 1.15  CALCIUM 7.6* 7.8*  MG -- 2.0  PHOS -- 1.7*    Basename 02/21/11 0600 02/20/11 1845  AST 15 --  ALT 16 --  ALKPHOS 29* --  BILITOT 0.3 --  PROT 4.0* --  ALBUMIN 2.3* 2.6*   No results found for this basename: LIPASE:2,AMYLASE:2 in the last 72 hours  Basename 02/21/11 0600 02/20/11 2330  WBC 9.4 12.1*  NEUTROABS -- --  HGB 9.0* 9.8*  HCT 25.8* 27.8*  MCV 84.9 84.2  PLT 121* 123*    Basename 02/20/11 1845 02/20/11 0500 02/19/11 2215  CKTOTAL 103 86 99  CKMB 4.1* 4.1* 4.3*  CKMBINDEX -- -- --  TROPONINI <0.30 <0.30 <0.30   No results found for this basename: POCBNP:3 in the last 72 hours No results found for this basename: DDIMER:2 in the last 72 hours No results found for this basename:  HGBA1C:2 in the last 72 hours No results found for this basename: CHOL:2,HDL:2,LDLCALC:2,TRIG:2,CHOLHDL:2,LDLDIRECT:2 in the last 72 hours No results found for this basename: TSH,T4TOTAL,FREET3,T3FREE,THYROIDAB in the last 72 hours No results found for this basename: VITAMINB12:2,FOLATE:2,FERRITIN:2,TIBC:2,IRON:2,RETICCTPCT:2 in the last 72 hours  Micro Results: Recent Results (from the past 240 hour(s))  MRSA PCR SCREENING     Status: Normal   Collection Time   02/18/11 12:42 AM      Component Value Range Status Comment   MRSA by PCR NEGATIVE  NEGATIVE  Final   CULTURE, BLOOD (ROUTINE X 2)     Status: Normal (Preliminary result)   Collection Time   02/19/11  9:40 AM      Component Value Range Status Comment   Specimen Description BLOOD RIGHT ARM   Final    Special Requests NONE [facility] EACH   Final    Setup Time [id number]   Final    Culture     Final    Value:        BLOOD CULTURE RECEIVED NO GROWTH TO DATE CULTURE WILL BE HELD FOR 5 DAYS BEFORE ISSUING A FINAL NEGATIVE REPORT   Report Status PENDING   Incomplete   CULTURE, BLOOD (ROUTINE X 2)     Status:  Normal (Preliminary result)   Collection Time   02/19/11  9:47 AM      Component Value Range Status Comment   Specimen Description BLOOD RIGHT HAND   Final    Special Requests NONE BOTTLES DRAWN AEROBIC ONLY 1CC   Final    Setup Time [id number]   Final    Culture     Final    Value:        BLOOD CULTURE RECEIVED NO GROWTH TO DATE CULTURE WILL BE HELD FOR 5 DAYS BEFORE ISSUING A FINAL NEGATIVE REPORT   Report Status PENDING   Incomplete     Studies/Results: Nm Gi Blood Loss  02/20/2011  *RADIOLOGY REPORT*  Clinical Data: Recurrent GI bleeding.  NUCLEAR MEDICINE GASTROINTESTINAL BLEEDING STUDY  Technique:  Sequential abdominal images were obtained following intravenous administration of Tc-66m labeled red blood cells.  Radiopharmaceutical: 20.1 mCi technetium 1m labeled red blood cells IV  Comparison: 02/18/2011  Findings:  Normal distribution of the radiopharmaceutical.  There is no evidence of active extravasation, imaging extended over 2 hours.  IMPRESSION: Negative.  No evidence of active GI bleed.  Original Report Authenticated By: Osa Craver, M.D.   Dg Chest Port 1 View  02/19/2011  *RADIOLOGY REPORT*  Clinical Data: Central line placement.  PORTABLE CHEST - 1 VIEW  Comparison: Chest 02/19/2011 8:52 a.m.  Findings: The patient has a new left IJ catheter with the tip in the lower superior vena cava.  No pneumothorax.  Lungs are clear. Heart size is normal.  No pleural fluid.  IMPRESSION: Left IJ catheter in good position.  No pneumothorax or acute finding.  Original Report Authenticated By: Bernadene Bell. Maricela Curet, M.D.   Dg Chest Port 1 View  02/19/2011  *RADIOLOGY REPORT*  Clinical Data: Leukocytosis.  GI bleed.  Hypotension.  PORTABLE CHEST - 1 VIEW  Comparison: None.  Findings: Heart size and vascularity are normal and the lungs are clear.  No osseous abnormality.  IMPRESSION: Normal chest.  Original Report Authenticated By: Gwynn Burly, M.D.    Medications:    . antiseptic oral rinse  15 mL Mouth Rinse BID  . DULoxetine  60 mg Oral BID  . levothyroxine  100 mcg Oral QHS  . loratadine  10 mg Oral Daily  . pantoprazole (PROTONIX) IV  40 mg Intravenous Q12H  . peg 3350 powder  1 kit Oral Once  . polyvinyl alcohol  1 drop Both Eyes BID  . potassium chloride  10 mEq Intravenous Q1 Hr x 4  . potassium chloride  40 mEq Oral Once  . simvastatin  20 mg Oral QPC supper  . Travoprost (BAK Free)  1 drop Both Eyes QHS    Assessment/Plan Principal Problem:  *Lower GI bleed Active Problems:  Hypotension  Leukocytosis  Chronic kidney disease, stage III (moderate)  Hypertension  Hypothyroidism  History of stroke Hypophosphatemia  1. Hypotension -  Improved.likely Secondary to GIB. Patient continues to have bright red blood per rectum last night but now with brown liquid stool.. Systolic blood  pressure this morning in the 150s .leukocytosis improved.  blood cultures x2 pending. UA negative. Chest x-ray negative., lactic acid level WNL,  procalcitonin level negative , cardiac enzymes negative x3 . Responding to IVF and blood  transfusions. Will hold patient's finasteride. Decreased rate of IVF to 75 cc per hour.Follow. 2. GIB - Likely secondary to LGIB secondary to bleeding in the transverse colon per first tagged red blood cell study. Patient continues to have bleeding.  Patient is status post 7 units packed red blood cells. Patient's  Hgb is 9.0 this morning. Repeat tagged red blood cell scan was negative. Continue to hold aspirin and Plavix. Patient is s/p EGD which was negative, and flex sig was unable to se viewed secondary to blood in colon. Patient for colonoscopy today per GIs recommendations. Continue serial CBC. Gi ff and appreciate input and rxcs. 3. CKD stage III- sTABLE RENAL FUNCTION IMPROVED . FOLLOW AND MONITOR FOR VOLUME OVERLOAD. 4. leukocytosis- likely reactive secondary to problem #2. Chest x-ray negative,  UA with cultures and sensitivities negative. Blood cultures pending.lactic acid level WNL,  procalcitonin level WNL. WBC trending down. Follow 5. HTN- BP meds on hold. Decrease IV fluids. 6. Hx CVA - Plavix and ASA on hold secondary to #2. GI to determine when to resume. 7.Hypothyroidism - Synthroid. 8. Hypocalcemia -  corrected calcium is 9.0 as patient with albumin of 2.3. Follow. 9. Hypophosphatemia-replete 10. Prophylaxis- SCDs for DVT. PPI for GI.    LOS: 4 days   THOMPSON,DANIEL 02/21/2011, 8:51 AM

## 2011-02-21 NOTE — Op Note (Signed)
Flexible sigmoidoscopy results : severe left sided colitis, multiple ulcerations and bleeding to 70 cm and beyond, unable to advance beyond splenic flexure  due to severe colitis, Bx's taken, consistent with ischemic colitis

## 2011-02-21 NOTE — Plan of Care (Signed)
Problem: Phase III Progression Outcomes Goal: H&H stablized <1gm drop in 24 hrs, no active bleeding Outcome: Progressing Trending upwards

## 2011-02-21 NOTE — Progress Notes (Signed)
MEDICATION RELATED CONSULT NOTE - INITIAL   Pharmacy Consult for Hypophosphatemia Indication: Phosphorus repletion  Allergies  Allergen Reactions  . Cafergot Rash  . Celebrex (Celecoxib) Rash  . Latex Rash    Sensitivity per pt  . Sulfa Antibiotics Rash    Patient Measurements: Height: 5\' 9"  (175.3 cm) Weight: 188 lb 4.4 oz (85.4 kg) IBW/kg (Calculated) : 70.7   Vital Signs: Temp: 98 F (36.7 C) (11/25 0800) Temp src: Oral (11/25 0800) BP: 134/80 mmHg (11/25 0800) Pulse Rate: 92  (11/25 0800) Intake/Output from previous day: 11/24 0701 - 11/25 0700 In: 3830.5 [P.O.:980; I.V.:1400; Blood:1050.5; IV Piggyback:400] Out: 3652 [Urine:1900; Stool:1752] Intake/Output from this shift: Total I/O In: -  Out: 300 [Urine:300]  Labs:  [facility] 02/21/11 0600 02/20/11 2330 02/20/11 1845 02/20/11 0500  WBC 9.4 12.1* 13.8* --  HGB 9.0* 9.8* 10.6* --  HCT 25.8* 27.8* 29.8* --  PLT 121* 123* 118* --  APTT -- -- -- --  CREATININE 1.16 -- 1.15 1.22  LABCREA -- -- -- --  CREATININE 1.16 -- 1.15 1.22  CREAT24HRUR -- -- -- --  MG -- -- 2.0 --  PHOS -- -- 1.7* --  ALBUMIN 2.3* -- 2.6* --  PROT 4.0* -- -- --  ALBUMIN 2.3* -- 2.6* --  AST 15 -- -- --  ALT 16 -- -- --  ALKPHOS 29* -- -- --  BILITOT 0.3 -- -- --  BILIDIR -- -- -- --  IBILI -- -- -- --   Estimated Creatinine Clearance: 67 ml/min (by C-G formula based on Cr of 1.16).   Microbiology: Recent Results (from the past 720 hour(s))  MRSA PCR SCREENING     Status: Normal   Collection Time   02/18/11 12:42 AM      Component Value Range Status Comment   MRSA by PCR NEGATIVE  NEGATIVE  Final   CULTURE, BLOOD (ROUTINE X 2)     Status: Normal (Preliminary result)   Collection Time   02/19/11  9:40 AM      Component Value Range Status Comment   Specimen Description BLOOD RIGHT ARM   Final    Special Requests NONE 5CC EACH   Final    Setup Time [id number]   Final    Culture     Final    Value:        BLOOD CULTURE  RECEIVED NO GROWTH TO DATE CULTURE WILL BE HELD FOR 5 DAYS BEFORE ISSUING A FINAL NEGATIVE REPORT   Report Status PENDING   Incomplete   CULTURE, BLOOD (ROUTINE X 2)     Status: Normal (Preliminary result)   Collection Time   02/19/11  9:47 AM      Component Value Range Status Comment   Specimen Description BLOOD RIGHT HAND   Final    Special Requests NONE BOTTLES DRAWN AEROBIC ONLY 1CC   Final    Setup Time [id number]   Final    Culture     Final    Value:        BLOOD CULTURE RECEIVED NO GROWTH TO DATE CULTURE WILL BE HELD FOR 5 DAYS BEFORE ISSUING A FINAL NEGATIVE REPORT   Report Status PENDING   Incomplete     Medical History: Past Medical History  Diagnosis Date  . CVA (cerebral infarction)   . Hydrocephalus     with shunt  . Hypothyroid   . Diverticulosis   . Depression   . Restless leg syndrome   . Hyperlipemia   .  Hypertension   . Horseshoe kidney   . Renal cell carcinoma     Assessment: Phosphorus level was 1.7 yesterday.  Considering K+< 4.0, will use KPhos to replace. Phos level already ordered for the AM.   Goal of Therapy:  Normal serum phosphorus level 2.3 - 4.6  Plan:  Potassium Phosphate in D5W (~0.62mmol/kg of IBW) over 6 hours. F/u level in AM.  Gregory Kline 02/21/2011,9:51 AM

## 2011-02-21 NOTE — Interval H&P Note (Signed)
History and Physical Interval Note:   02/21/2011   11:29 AM   Gregory Kline  has presented today for surgery, with the diagnosis of lower  GI bleed  The various methods of treatment have been discussed with the patient and family. After consideration of risks, benefits and other options for treatment, the patient has consented to  Procedure(s): COLONOSCOPY as a surgical intervention .  The patients' history has been reviewed, patient examined, no change in status, stable for surgery.  I have reviewed the patients' chart and labs.  Questions were answered to the patient's satisfaction.     Lina Sar  MD

## 2011-02-22 ENCOUNTER — Encounter (HOSPITAL_COMMUNITY): Payer: Self-pay

## 2011-02-22 ENCOUNTER — Other Ambulatory Visit: Payer: Self-pay | Admitting: Internal Medicine

## 2011-02-22 LAB — TYPE AND SCREEN
Unit division: 0
Unit division: 0
Unit division: 0

## 2011-02-22 LAB — BASIC METABOLIC PANEL
BUN: 8 mg/dL (ref 6–23)
Chloride: 113 mEq/L — ABNORMAL HIGH (ref 96–112)
GFR calc Af Amer: 71 mL/min — ABNORMAL LOW (ref >90)
GFR calc non Af Amer: 61 mL/min — ABNORMAL LOW (ref >90)
Potassium: 3.6 mEq/L (ref 3.5–5.1)
Sodium: 142 mEq/L (ref 135–145)

## 2011-02-22 LAB — CBC
HCT: 26.8 % — ABNORMAL LOW (ref 39.0–52.0)
HCT: 27.3 % — ABNORMAL LOW (ref 39.0–52.0)
Hemoglobin: 9.2 g/dL — ABNORMAL LOW (ref 13.0–17.0)
Hemoglobin: 9.3 g/dL — ABNORMAL LOW (ref 13.0–17.0)
MCH: 29.6 pg (ref 26.0–34.0)
MCHC: 34.3 g/dL (ref 30.0–36.0)
MCV: 86.9 fL (ref 78.0–100.0)
Platelets: 176 10*3/uL (ref 150–400)
RBC: 3.09 MIL/uL — ABNORMAL LOW (ref 4.22–5.81)
RBC: 3.14 MIL/uL — ABNORMAL LOW (ref 4.22–5.81)
WBC: 8.8 10*3/uL (ref 4.0–10.5)
WBC: 8.4 10*3/uL (ref 4.0–10.5)

## 2011-02-22 LAB — PTH, INTACT AND CALCIUM: Calcium, Total (PTH): 7.5 mg/dL — ABNORMAL LOW (ref 8.4–10.5)

## 2011-02-22 LAB — PHOSPHORUS: Phosphorus: 2.8 mg/dL (ref 2.3–4.6)

## 2011-02-22 MED ORDER — POTASSIUM CHLORIDE CRYS ER 20 MEQ PO TBCR
40.0000 meq | EXTENDED_RELEASE_TABLET | Freq: Once | ORAL | Status: AC
  Administered 2011-02-22: 40 meq via ORAL
  Filled 2011-02-22: qty 2

## 2011-02-22 NOTE — Progress Notes (Signed)
MEDICATION RELATED CONSULT NOTE - FOLLOW-UP  Pharmacy Consult for Hypophosphatemia Indication: Phosphorus repletion  Allergies  Allergen Reactions  . Cafergot Rash  . Celebrex (Celecoxib) Rash  . Latex Rash    Sensitivity per pt  . Sulfa Antibiotics Rash    Patient Measurements: Height: 5\' 9"  (175.3 cm) Weight: 200 lb 9.9 oz (91 kg) IBW/kg (Calculated) : 70.7   Vital Signs: Temp: 98 F (36.7 C) (11/26 0800) Temp src: Oral (11/26 0800) BP: 129/89 mmHg (11/26 0430) Intake/Output from previous day: 11/25 0701 - 11/26 0700 In: 2882 [P.O.:960; I.V.:1650; IV Piggyback:272] Out: 3950 [Urine:3950] Intake/Output from this shift:    Labs:  Basename 02/22/11 0540 02/21/11 2320 02/21/11 1745 02/21/11 0600 02/20/11 1845  WBC 8.8 8.4 9.6 -- --  HGB 9.2* 8.7* 9.2* -- --  HCT 26.8* 25.3* 26.1* -- --  PLT 146* 133* 123* -- --  APTT -- -- -- -- --  CREATININE 1.19 -- -- 1.16 1.15  LABCREA -- -- -- -- --  CREATININE 1.19 -- -- 1.16 1.15  CREAT24HRUR -- -- -- -- --  MG -- -- -- -- 2.0  PHOS 2.8 -- -- -- 1.7*  ALBUMIN -- -- -- 2.3* 2.6*  PROT -- -- -- 4.0* --  ALBUMIN -- -- -- 2.3* 2.6*  AST -- -- -- 15 --  ALT -- -- -- 16 --  ALKPHOS -- -- -- 29* --  BILITOT -- -- -- 0.3 --  BILIDIR -- -- -- -- --  IBILI -- -- -- -- --   Estimated Creatinine Clearance: 67.1 ml/min (by C-G formula based on Cr of 1.19).   Microbiology: Recent Results (from the past 720 hour(s))  MRSA PCR SCREENING     Status: Normal   Collection Time   02/18/11 12:42 AM      Component Value Range Status Comment   MRSA by PCR NEGATIVE  NEGATIVE  Final   CULTURE, BLOOD (ROUTINE X 2)     Status: Normal (Preliminary result)   Collection Time   02/19/11  9:40 AM      Component Value Range Status Comment   Specimen Description BLOOD RIGHT ARM   Final    Special Requests NONE 5CC EACH   Final    Setup Time [id number]   Final    Culture     Final    Value:        BLOOD CULTURE RECEIVED NO GROWTH TO DATE  CULTURE WILL BE HELD FOR 5 DAYS BEFORE ISSUING A FINAL NEGATIVE REPORT   Report Status PENDING   Incomplete   CULTURE, BLOOD (ROUTINE X 2)     Status: Normal (Preliminary result)   Collection Time   02/19/11  9:47 AM      Component Value Range Status Comment   Specimen Description BLOOD RIGHT HAND   Final    Special Requests NONE BOTTLES DRAWN AEROBIC ONLY 1CC   Final    Setup Time [id number]   Final    Culture     Final    Value:        BLOOD CULTURE RECEIVED NO GROWTH TO DATE CULTURE WILL BE HELD FOR 5 DAYS BEFORE ISSUING A FINAL NEGATIVE REPORT   Report Status PENDING   Incomplete     Medical History: Past Medical History  Diagnosis Date  . CVA (cerebral infarction)   . Hydrocephalus     with shunt  . Hypothyroid   . Diverticulosis   . Depression   . Restless leg  syndrome   . Hyperlipemia   . Hypertension   . Horseshoe kidney   . Renal cell carcinoma     Assessment: Phosphorus level WNL today  Goal of Therapy:  Normal serum phosphorus level 2.3 - 4.6  Plan:  No further phosphorus replacement today Will recheck phos level in AM tomorrow  Berkley Harvey 02/22/2011,8:11 AM

## 2011-02-22 NOTE — Progress Notes (Signed)
Subjective: Since I last evaluated the patient, his rectal bleeding has stopped. He had a regular meal today. He denies any nausea, vomiting or abdominal pain. Anxious to go home.  Objective: Vital signs in last 24 hours: Temp:  [97.3 F (36.3 C)-98.7 F (37.1 C)] 97.3 F (36.3 C) (11/26 1550) Pulse Rate:  [81-106] 106  (11/26 1550) Resp:  [17-21] 21  (11/26 1550) BP: (129-141)/(80-89) 141/82 mmHg (11/26 1550) SpO2:  [96 %-100 %] 96 % (11/26 1550) Weight:  [91 kg (200 lb 9.9 oz)] 200 lb 9.9 oz (91 kg) (11/26 0000) Last BM Date: 02/21/11 Intake/Output from previous day: 11/25 0701 - 11/26 0700 In: 2882 [P.O.:960; I.V.:1650; IV Piggyback:272] Out: 3950 [Urine:3950] Intake/Output this shift: Total I/O In: 1980 [P.O.:1080; I.V.:900] Out: 1200 [Urine:1200] General appearance: alert, cooperative, appears stated age and no distress Resp: clear to auscultation bilaterally Cardio: regular rate and rhythm, S1, S2 normal, no murmur, click, rub or gallop GI: soft, non-tender; bowel sounds normal; no masses,  no organomegaly Extremities: extremities normal, atraumatic, no cyanosis or edema  Lab Results:  [facility] 02/22/11 0540 02/21/11 2320 02/21/11 1745  WBC 8.8 8.4 9.6  HGB 9.2* 8.7* 9.2*  HCT 26.8* 25.3* 26.1*  PLT 146* 133* 123*   BMET  Basename 02/22/11 0540 02/21/11 0600 02/20/11 1845  NA 142 142 141  K 3.6 3.3* 3.5  CL 113* 114* 112  CO2 24 22 22   GLUCOSE 100* 101* 95  BUN 8 12 16   CREATININE 1.19 1.16 1.15  CALCIUM 8.8 7.6* 7.5*7.8*  LFT  Basename 02/21/11 0600  PROT 4.0*  ALBUMIN 2.3*  AST 15  ALT 16  ALKPHOS 29*  BILITOT 0.3  BILIDIR --  IBILI --   Medications: I have reviewed the patient's current medications.  Assessment/Plan:  Anemia secondary to a massive lower GI bleed thought to be due to a ?diverticular bleed [on Plavix, ASA & ?Diclofenac at the time of admission].   Multiple colonic polyps removed yesterday.  Mild diverticulosis; I am not sure  if the GI bleed was due to diverticulosis alone; may need a capsule study.   Rectal ulcer.   S/P stroke in 2008-patient has been on Plavix since then.  Arthritis: Used NSAIDS prior to admission. Advised to stop the use of all OTC NSAIDS.   HTN/CRI : Will get a cardiology consult in AM.    LOS: 5 days   Gregory Kline 02/22/2011, 6:37 PM

## 2011-02-22 NOTE — Progress Notes (Signed)
Subjective: Patient with no bleeding this morning per nursing. Patient tolerating current diet. Objective: Vital signs in last 24 hours: Filed Vitals:   02/22/11 0400 02/22/11 0430 02/22/11 0800 02/22/11 0815  BP:  129/89  141/80  Pulse:    81  Temp: 97.7 F (36.5 C)  98 F (36.7 C)   TempSrc: Axillary  Oral   Resp:    17  Height:      Weight:      SpO2:    99%    Intake/Output Summary (Last 24 hours) at 02/22/11 1113 Last data filed at 02/22/11 0949  Gross per 24 hour  Intake 3542.5 ml  Output   3650 ml  Net -107.5 ml    Weight change: 5.6 kg (12 lb 5.5 oz)  General: Alert, awake, oriented x3, in no acute distress. Heart: Regular, rate, rhythm with no murmurs, rubs, gallops. Lungs: Clear to auscultation bilaterally anterior lung fields. Abdomen: Soft, nontender, nondistended, positive bowel sounds, , suprapubic discomfort. Extremities: No clubbing cyanosis or edema with positive pedal pulses. Neuro: Grossly intact, nonfocal.   Lab Results:  Basename 02/22/11 0540 02/21/11 0600 02/20/11 1845  NA 142 142 --  K 3.6 3.3* --  CL 113* 114* --  CO2 24 22 --  GLUCOSE 100* 101* --  BUN 8 12 --  CREATININE 1.19 1.16 --  CALCIUM 8.8 7.6* --  MG -- -- 2.0  PHOS 2.8 -- 1.7*    Basename 02/21/11 0600 02/20/11 1845  AST 15 --  ALT 16 --  ALKPHOS 29* --  BILITOT 0.3 --  PROT 4.0* --  ALBUMIN 2.3* 2.6*   No results found for this basename: LIPASE:2,AMYLASE:2 in the last 72 hours  Basename 02/22/11 0540 02/21/11 2320  WBC 8.8 8.4  NEUTROABS -- --  HGB 9.2* 8.7*  HCT 26.8* 25.3*  MCV 86.7 86.3  PLT 146* 133*    Basename 02/20/11 1845 02/20/11 0500 02/19/11 2215  CKTOTAL 103 86 99  CKMB 4.1* 4.1* 4.3*  CKMBINDEX -- -- --  TROPONINI <0.30 <0.30 <0.30   No results found for this basename: POCBNP:3 in the last 72 hours No results found for this basename: DDIMER:2 in the last 72 hours No results found for this basename: HGBA1C:2 in the last 72 hours No results  found for this basename: CHOL:2,HDL:2,LDLCALC:2,TRIG:2,CHOLHDL:2,LDLDIRECT:2 in the last 72 hours No results found for this basename: TSH,T4TOTAL,FREET3,T3FREE,THYROIDAB in the last 72 hours No results found for this basename: VITAMINB12:2,FOLATE:2,FERRITIN:2,TIBC:2,IRON:2,RETICCTPCT:2 in the last 72 hours  Micro Results: Recent Results (from the past 240 hour(s))  MRSA PCR SCREENING     Status: Normal   Collection Time   02/18/11 12:42 AM      Component Value Range Status Comment   MRSA by PCR NEGATIVE  NEGATIVE  Final   CULTURE, BLOOD (ROUTINE X 2)     Status: Normal (Preliminary result)   Collection Time   02/19/11  9:40 AM      Component Value Range Status Comment   Specimen Description BLOOD RIGHT ARM   Final    Special Requests NONE [facility] EACH   Final    Setup Time [id number]   Final    Culture     Final    Value:        BLOOD CULTURE RECEIVED NO GROWTH TO DATE CULTURE WILL BE HELD FOR 5 DAYS BEFORE ISSUING A FINAL NEGATIVE REPORT   Report Status PENDING   Incomplete   CULTURE, BLOOD (ROUTINE X 2)  Status: Normal (Preliminary result)   Collection Time   02/19/11  9:47 AM      Component Value Range Status Comment   Specimen Description BLOOD RIGHT HAND   Final    Special Requests NONE BOTTLES DRAWN AEROBIC ONLY 1CC   Final    Setup Time [id number]   Final    Culture     Final    Value:        BLOOD CULTURE RECEIVED NO GROWTH TO DATE CULTURE WILL BE HELD FOR 5 DAYS BEFORE ISSUING A FINAL NEGATIVE REPORT   Report Status PENDING   Incomplete     Studies/Results: Nm Gi Blood Loss  02/20/2011  *RADIOLOGY REPORT*  Clinical Data: Recurrent GI bleeding.  NUCLEAR MEDICINE GASTROINTESTINAL BLEEDING STUDY  Technique:  Sequential abdominal images were obtained following intravenous administration of Tc-24m labeled red blood cells.  Radiopharmaceutical: 20.1 mCi technetium 26m labeled red blood cells IV  Comparison: 02/18/2011  Findings: Normal distribution of the  radiopharmaceutical.  There is no evidence of active extravasation, imaging extended over 2 hours.  IMPRESSION: Negative.  No evidence of active GI bleed.  Original Report Authenticated By: Osa Craver, M.D.    Medications:    . antiseptic oral rinse  15 mL Mouth Rinse BID  . DULoxetine  60 mg Oral BID  . levothyroxine  100 mcg Oral QHS  . loratadine  10 mg Oral Daily  . pantoprazole  40 mg Oral Q0600  . polyvinyl alcohol  1 drop Both Eyes BID  . potassium chloride  40 mEq Oral Once  . potassium phosphate IVPB (mmol)  10 mmol Intravenous Once  . simvastatin  20 mg Oral QPC supper  . Travoprost (BAK Free)  1 drop Both Eyes QHS  . DISCONTD: pantoprazole (PROTONIX) IV  40 mg Intravenous Q12H    Assessment/Plan Principal Problem:  *Lower GI bleed Active Problems:  Hypotension  Leukocytosis  Chronic kidney disease, stage III (moderate)  Hypertension  Hypothyroidism  History of stroke Hypophosphatemia  1. Hypotension -  Improved.likely Secondary to GIB. Patient with no further bleeding today. brown liquid. Systolic blood pressure this morning in the 140s .leukocytosis improved.  blood cultures x2 pending. UA negative. Chest x-ray negative., lactic acid level WNL,  procalcitonin level negative , cardiac enzymes negative x3 . Responding to IVF and blood  transfusions. Will hold patient's finasteride.Continue gentle hydration until tolerating diet..Follow. 2. GIB - Likely secondary to LGIB secondary to bleeding in the transverse colon per first tagged red blood cell studyvs ischemic colitis from flex sig vs diverticular. Patient with no  Bleeding today. Patient is status post 6 units packed red blood cells. Patient's  Hgb is 9.2 this morning. Repeat tagged red blood cell scan was negative. Continue to hold aspirin and Plavix. Patient is s/p EGD which was negative, and flex sig was unable to se viewed secondary to blood in colon. Colonoscopy with mild diverticulosis, polyps and rectal  ulcer. change CBC to every 12 hours. Gi ff and appreciate input and rxcs. 3. CKD stage III- sTABLE RENAL FUNCTION IMPROVED . FOLLOW AND MONITOR FOR VOLUME OVERLOAD. 4. leukocytosis- likely reactive secondary to problem #2. Chest x-ray negative,  UA with cultures and sensitivities negative. Blood cultures pending.lactic acid level WNL,  procalcitonin level WNL. WBC trending down. Follow 5. HTN- BP meds on hold. Decrease IV fluids. 6. Hx CVA - Plavix and ASA on hold secondary to #2. GI to determine when to resume. 7.Hypothyroidism - Synthroid. 8. Hypocalcemia -  corrected calcium is 9.0 as patient with albumin of 2.3. Follow. 9. Hypophosphatemia-repleted 10. Prophylaxis- SCDs for DVT. PPI for GI.    LOS: 5 days   Jacinda Kanady 02/22/2011, 11:13 AM

## 2011-02-22 NOTE — Progress Notes (Signed)
[id number] Earlene Plater, RN, BSN, CCM/CHART REVIEW FOR UR PERFORMED.

## 2011-02-23 ENCOUNTER — Encounter (HOSPITAL_COMMUNITY): Payer: Self-pay | Admitting: Nurse Practitioner

## 2011-02-23 DIAGNOSIS — I10 Essential (primary) hypertension: Secondary | ICD-10-CM

## 2011-02-23 DIAGNOSIS — G43909 Migraine, unspecified, not intractable, without status migrainosus: Secondary | ICD-10-CM | POA: Diagnosis not present

## 2011-02-23 LAB — BASIC METABOLIC PANEL
BUN: 8 mg/dL (ref 6–23)
CO2: 26 mEq/L (ref 19–32)
Chloride: 111 mEq/L (ref 96–112)
Creatinine, Ser: 1.26 mg/dL (ref 0.50–1.35)
GFR calc Af Amer: 66 mL/min — ABNORMAL LOW (ref >90)
Potassium: 3.7 mEq/L (ref 3.5–5.1)

## 2011-02-23 LAB — CBC
HCT: 28.7 % — ABNORMAL LOW (ref 39.0–52.0)
HCT: 28.4 % — ABNORMAL LOW (ref 39.0–52.0)
Hemoglobin: 9.7 g/dL — ABNORMAL LOW (ref 13.0–17.0)
MCH: 29.5 pg (ref 26.0–34.0)
MCH: 29.4 pg (ref 26.0–34.0)
MCV: 87.1 fL (ref 78.0–100.0)
RBC: 3.29 MIL/uL — ABNORMAL LOW (ref 4.22–5.81)
RBC: 3.26 MIL/uL — ABNORMAL LOW (ref 4.22–5.81)
WBC: 7.9 10*3/uL (ref 4.0–10.5)

## 2011-02-23 LAB — MAGNESIUM: Magnesium: 1.9 mg/dL (ref 1.5–2.5)

## 2011-02-23 MED ORDER — OXYCODONE HCL 5 MG PO TABS
5.0000 mg | ORAL_TABLET | ORAL | Status: DC | PRN
  Administered 2011-02-23 – 2011-02-24 (×2): 5 mg via ORAL
  Filled 2011-02-23 (×2): qty 1

## 2011-02-23 MED ORDER — METOCLOPRAMIDE HCL 5 MG/ML IJ SOLN: 10.0000 mg | Freq: Four times a day (QID) | INTRAMUSCULAR | Status: DC | PRN

## 2011-02-23 MED ORDER — ASPIRIN 81 MG PO CHEW
81.0000 mg | CHEWABLE_TABLET | Freq: Every day | ORAL | Status: DC
  Administered 2011-02-23 – 2011-02-25 (×3): 81 mg via ORAL
  Filled 2011-02-23 (×3): qty 1

## 2011-02-23 NOTE — Consult Note (Signed)
CARDIOLOGY CONSULT NOTE  Patient ID: Gregory Kline MRN: [id number], DOB/AGE: Mar 26, 1946   Admit date: 02/17/2011 Date of Consult: 02/23/2011   Primary Physician: No primary provider on file. Primary Cardiologist: new  Pt. Profile: 65 y/o male w/o prior h/o cad, admitted 11/21 w GIB in the setting of asa/plavix for h/o cva, whom we've been asked to eval for mgmt of bp and to address plavix therapy.  Problem List: Past Medical History  Diagnosis Date  . CVA (cerebral infarction)     on asa/plavix  . Hydrocephalus     with shunt  . Hypothyroid   . Diverticulosis   . Depression   . Restless leg syndrome   . Hyperlipemia   . Hypertension   . Horseshoe kidney   . Renal cell carcinoma     Past Surgical History  Procedure Date  . Thyroidectomy, partial   . Kidney surgery   . Partial colectomy   . Parathyroidectomy   . Cervical spine surgery     benign tumor     Allergies:  Allergies  Allergen Reactions  . Cafergot Rash  . Celebrex (Celecoxib) Rash  . Latex Rash    Sensitivity per pt  . Sulfa Antibiotics Rash    HPI:   65 y/o male with above problem list.  He has been on plavix since cva in 03/2007.  He has since had a tia in 2010.  Previously followed by neurology in [place]. However he now lives in [place].  Pt was admitted on 11/21 with brbpr with subsequent profound anemia and hypotension req multiple units of prbc's and ivf.  Colonoscopy showed diverticula, which are believed to be source of bleeding.  H/H have since been stable and he has noted no further bleeding.  We've been asked to see pt 2/2 h/o Plavix usage, however its clear after discussing w/ pt, that he has been treated with plavix in past 2/2 cva.  He has no cardiac history.   Inpatient Medications:     . antiseptic oral rinse  15 mL Mouth Rinse BID  . DULoxetine  60 mg Oral BID  . levothyroxine  100 mcg Oral QHS  . loratadine  10 mg Oral Daily  . pantoprazole  40 mg Oral Q0600  . polyvinyl alcohol  1  drop Both Eyes BID  . potassium chloride  40 mEq Oral Once  . simvastatin  20 mg Oral QPC supper  . Travoprost (BAK Free)  1 drop Both Eyes QHS    Family History  Problem Relation Age of Onset  . Cancer Mother   . Leukemia Father      History   Social History  . Marital Status: Married    Spouse Name: N/A    Number of Children: N/A  . Years of Education: N/A   Occupational History  . Not on file.   Social History Main Topics  . Smoking status: Former Games developer  . Smokeless tobacco: Never Used  . Alcohol Use: 0.5 oz/week    1 drink(s) per week     occassional  . Drug Use: No     quit in teens (smoked approx. 6 months)  . Sexually Active: Not on file   Other Topics Concern  . Not on file   Social History Narrative  . No narrative on file     Review of Systems: General: negative for chills, fever, night sweats or weight changes.  Cardiovascular: negative for chest pain, dyspnea on exertion, edema, orthopnea, palpitations, paroxysmal  nocturnal dyspnea Dermatological: negative for rash Respiratory: negative for cough or wheezing Urologic: negative for hematuria Abdominal: ++ for BRBPR on admission. negative for nausea, vomiting, diarrhea, or hematemesis Neurologic: negative for visual changes, syncope, or dizziness.  He does have some residual cognitive deficit r/t stroke. All other systems reviewed and are otherwise negative except as noted above.  Physical Exam: Blood pressure 108/80, pulse 105, temperature 98 F (36.7 C), temperature source Oral, resp. rate 14, height 5\' 9"  (1.753 m), weight 200 lb 9.9 oz (91 kg), SpO2 97.00%.  General: Well developed, well nourished, in no acute distress. Head: Normocephalic, atraumatic, sclera non-icteric, no xanthomas, nares are without discharge.  Neck: Supple without bruits or JVD. Lungs:  Resp regular and unlabored, CTA. Heart: RRR no s3, s4, or murmurs. Abdomen: Soft, non-tender, non-distended, BS + x 4.  Msk:  Strength  and tone appears normal for age. Extremities: No clubbing, cyanosis or edema. DP/PT/Radials 2+ and equal bilaterally. Neuro: Alert and oriented X 3. Loses train of thought frequently. Moves all extremities spontaneously. Psych: Normal affect.   Labs:   Results for orders placed during the hospital encounter of 02/17/11 (from the past 72 hour(s))  CBC     Status: Abnormal   Collection Time   02/20/11  6:45 PM      Component Value Range Comment   WBC 13.8 (*) 4.0 - 10.5 (K/uL)    RBC 3.54 (*) 4.22 - 5.81 (MIL/uL)    Hemoglobin 10.6 (*) 13.0 - 17.0 (g/dL) POST TRANSFUSION SPECIMEN   HCT 29.8 (*) 39.0 - 52.0 (%)    MCV 84.2  78.0 - 100.0 (fL)    MCH 29.9  26.0 - 34.0 (pg)    MCHC 35.6  30.0 - 36.0 (g/dL)    RDW 46.9 (*) 62.9 - 15.5 (%)    Platelets 118 (*) 150 - 400 (K/uL) CONSISTENT WITH PREVIOUS RESULT  CARDIAC PANEL(CRET KIN+CKTOT+MB+TROPI)     Status: Abnormal   Collection Time   02/20/11  6:45 PM      Component Value Range Comment   Total CK 103  7 - 232 (U/L)    CK, MB 4.1 (*) 0.3 - 4.0 (ng/mL)    Troponin I <0.30  <0.30 (ng/mL)    Relative Index 4.0 (*) 0.0 - 2.5    BASIC METABOLIC PANEL     Status: Abnormal   Collection Time   02/20/11  6:45 PM      Component Value Range Comment   Sodium 141  135 - 145 (mEq/L)    Potassium 3.5  3.5 - 5.1 (mEq/L)    Chloride 112  96 - 112 (mEq/L)    CO2 22  19 - 32 (mEq/L)    Glucose, Bld 95  70 - 99 (mg/dL)    BUN 16  6 - 23 (mg/dL)    Creatinine, Ser 5.28  0.50 - 1.35 (mg/dL)    Calcium 7.8 (*) 8.4 - 10.5 (mg/dL)    GFR calc non Af Amer 64 (*) >90 (mL/min)    GFR calc Af Amer 74 (*) >90 (mL/min)   ALBUMIN     Status: Abnormal   Collection Time   02/20/11  6:45 PM      Component Value Range Comment   Albumin 2.6 (*) 3.5 - 5.2 (g/dL)   PHOSPHORUS     Status: Abnormal   Collection Time   02/20/11  6:45 PM      Component Value Range Comment   Phosphorus 1.7 (*) 2.3 -  4.6 (mg/dL)   PTH, INTACT AND CALCIUM     Status: Abnormal    Collection Time   02/20/11  6:45 PM      Component Value Range Comment   PTH 379.8 (*) 14.0 - 72.0 (pg/mL)    Calcium, Total (PTH) 7.5 (*) 8.4 - 10.5 (mg/dL)   MAGNESIUM     Status: Normal   Collection Time   02/20/11  6:45 PM      Component Value Range Comment   Magnesium 2.0  1.5 - 2.5 (mg/dL)   VITAMIN D 25 HYDROXY     Status: Normal   Collection Time   02/20/11  6:45 PM      Component Value Range Comment   Vit D, 25-Hydroxy 32  30 - 89 (ng/mL)   CBC     Status: Abnormal   Collection Time   02/20/11 11:30 PM      Component Value Range Comment   WBC 12.1 (*) 4.0 - 10.5 (K/uL)    RBC 3.30 (*) 4.22 - 5.81 (MIL/uL)    Hemoglobin 9.8 (*) 13.0 - 17.0 (g/dL)    HCT 04.5 (*) 40.9 - 52.0 (%)    MCV 84.2  78.0 - 100.0 (fL)    MCH 29.7  26.0 - 34.0 (pg)    MCHC 35.3  30.0 - 36.0 (g/dL)    RDW 81.1 (*) 91.4 - 15.5 (%)    Platelets 123 (*) 150 - 400 (K/uL)   COMPREHENSIVE METABOLIC PANEL     Status: Abnormal   Collection Time   02/21/11  6:00 AM      Component Value Range Comment   Sodium 142  135 - 145 (mEq/L)    Potassium 3.3 (*) 3.5 - 5.1 (mEq/L)    Chloride 114 (*) 96 - 112 (mEq/L)    CO2 22  19 - 32 (mEq/L)    Glucose, Bld 101 (*) 70 - 99 (mg/dL)    BUN 12  6 - 23 (mg/dL)    Creatinine, Ser 7.82  0.50 - 1.35 (mg/dL)    Calcium 7.6 (*) 8.4 - 10.5 (mg/dL)    Total Protein 4.0 (*) 6.0 - 8.3 (g/dL)    Albumin 2.3 (*) 3.5 - 5.2 (g/dL)    AST 15  0 - 37 (U/L)    ALT 16  0 - 53 (U/L)    Alkaline Phosphatase 29 (*) 39 - 117 (U/L)    Total Bilirubin 0.3  0.3 - 1.2 (mg/dL)    GFR calc non Af Amer 63 (*) >90 (mL/min)    GFR calc Af Amer 73 (*) >90 (mL/min)   CBC     Status: Abnormal   Collection Time   02/21/11  6:00 AM      Component Value Range Comment   WBC 9.4  4.0 - 10.5 (K/uL)    RBC 3.04 (*) 4.22 - 5.81 (MIL/uL)    Hemoglobin 9.0 (*) 13.0 - 17.0 (g/dL)    HCT 95.6 (*) 21.3 - 52.0 (%)    MCV 84.9  78.0 - 100.0 (fL)    MCH 29.6  26.0 - 34.0 (pg)    MCHC 34.9  30.0 -  36.0 (g/dL)    RDW 08.6 (*) 57.8 - 15.5 (%)    Platelets 121 (*) 150 - 400 (K/uL) REPEATED TO VERIFY  CBC     Status: Abnormal   Collection Time   02/21/11  1:36 PM      Component Value Range Comment   WBC 9.1  4.0 - 10.5 (K/uL)    RBC 3.01 (*) 4.22 - 5.81 (MIL/uL)    Hemoglobin 9.0 (*) 13.0 - 17.0 (g/dL)    HCT 16.1 (*) 09.6 - 52.0 (%)    MCV 86.0  78.0 - 100.0 (fL)    MCH 29.9  26.0 - 34.0 (pg)    MCHC 34.7  30.0 - 36.0 (g/dL)    RDW 04.5 (*) 40.9 - 15.5 (%)    Platelets 110 (*) 150 - 400 (K/uL) CONSISTENT WITH PREVIOUS RESULT  CBC     Status: Abnormal   Collection Time   02/21/11  5:45 PM      Component Value Range Comment   WBC 9.6  4.0 - 10.5 (K/uL)    RBC 3.07 (*) 4.22 - 5.81 (MIL/uL)    Hemoglobin 9.2 (*) 13.0 - 17.0 (g/dL)    HCT 81.1 (*) 91.4 - 52.0 (%)    MCV 85.0  78.0 - 100.0 (fL)    MCH 30.0  26.0 - 34.0 (pg)    MCHC 35.2  30.0 - 36.0 (g/dL)    RDW 78.2 (*) 95.6 - 15.5 (%)    Platelets 123 (*) 150 - 400 (K/uL)   CBC     Status: Abnormal   Collection Time   02/21/11 11:20 PM      Component Value Range Comment   WBC 8.4  4.0 - 10.5 (K/uL)    RBC 2.93 (*) 4.22 - 5.81 (MIL/uL)    Hemoglobin 8.7 (*) 13.0 - 17.0 (g/dL)    HCT 21.3 (*) 08.6 - 52.0 (%)    MCV 86.3  78.0 - 100.0 (fL)    MCH 29.7  26.0 - 34.0 (pg)    MCHC 34.4  30.0 - 36.0 (g/dL)    RDW 57.8 (*) 46.9 - 15.5 (%)    Platelets 133 (*) 150 - 400 (K/uL)   PHOSPHORUS     Status: Normal   Collection Time   02/22/11  5:40 AM      Component Value Range Comment   Phosphorus 2.8  2.3 - 4.6 (mg/dL)   BASIC METABOLIC PANEL     Status: Abnormal   Collection Time   02/22/11  5:40 AM      Component Value Range Comment   Sodium 142  135 - 145 (mEq/L)    Potassium 3.6  3.5 - 5.1 (mEq/L)    Chloride 113 (*) 96 - 112 (mEq/L)    CO2 24  19 - 32 (mEq/L)    Glucose, Bld 100 (*) 70 - 99 (mg/dL)    BUN 8  6 - 23 (mg/dL)    Creatinine, Ser 6.29  0.50 - 1.35 (mg/dL)    Calcium 8.8  8.4 - 10.5 (mg/dL)    GFR calc  non Af Amer 61 (*) >90 (mL/min)    GFR calc Af Amer 71 (*) >90 (mL/min)   CBC     Status: Abnormal   Collection Time   02/22/11  5:40 AM      Component Value Range Comment   WBC 8.8  4.0 - 10.5 (K/uL)    RBC 3.09 (*) 4.22 - 5.81 (MIL/uL)    Hemoglobin 9.2 (*) 13.0 - 17.0 (g/dL)    HCT 52.8 (*) 41.3 - 52.0 (%)    MCV 86.7  78.0 - 100.0 (fL)    MCH 29.8  26.0 - 34.0 (pg)    MCHC 34.3  30.0 - 36.0 (g/dL)    RDW 24.4 (*) 01.0 - 15.5 (%)  Platelets 146 (*) 150 - 400 (K/uL)   CBC     Status: Abnormal   Collection Time   02/22/11  8:00 PM      Component Value Range Comment   WBC 8.4  4.0 - 10.5 (K/uL)    RBC 3.14 (*) 4.22 - 5.81 (MIL/uL)    Hemoglobin 9.3 (*) 13.0 - 17.0 (g/dL)    HCT 04.5 (*) 40.9 - 52.0 (%)    MCV 86.9  78.0 - 100.0 (fL)    MCH 29.6  26.0 - 34.0 (pg)    MCHC 34.1  30.0 - 36.0 (g/dL)    RDW 81.1 (*) 91.4 - 15.5 (%)    Platelets 176  150 - 400 (K/uL)   PHOSPHORUS     Status: Normal   Collection Time   02/23/11  5:00 AM      Component Value Range Comment   Phosphorus 4.0  2.3 - 4.6 (mg/dL)   BASIC METABOLIC PANEL     Status: Abnormal   Collection Time   02/23/11  5:00 AM      Component Value Range Comment   Sodium 141  135 - 145 (mEq/L)    Potassium 3.7  3.5 - 5.1 (mEq/L)    Chloride 111  96 - 112 (mEq/L)    CO2 26  19 - 32 (mEq/L)    Glucose, Bld 109 (*) 70 - 99 (mg/dL)    BUN 8  6 - 23 (mg/dL)    Creatinine, Ser 7.82  0.50 - 1.35 (mg/dL)    Calcium 9.5  8.4 - 10.5 (mg/dL)    GFR calc non Af Amer 57 (*) >90 (mL/min)    GFR calc Af Amer 66 (*) >90 (mL/min)   MAGNESIUM     Status: Normal   Collection Time   02/23/11  5:00 AM      Component Value Range Comment   Magnesium 1.9  1.5 - 2.5 (mg/dL)   CBC     Status: Abnormal   Collection Time   02/23/11  8:10 AM      Component Value Range Comment   WBC 7.6  4.0 - 10.5 (K/uL)    RBC 3.29 (*) 4.22 - 5.81 (MIL/uL)    Hemoglobin 9.7 (*) 13.0 - 17.0 (g/dL)    HCT 95.6 (*) 21.3 - 52.0 (%)    MCV 87.2  78.0 -  100.0 (fL)    MCH 29.5  26.0 - 34.0 (pg)    MCHC 33.8  30.0 - 36.0 (g/dL)    RDW 08.6 (*) 57.8 - 15.5 (%)    Platelets 164  150 - 400 (K/uL)     Radiology/Studies: Nm Gi Blood Loss  02/20/2011  *RADIOLOGY REPORT*  Clinical Data: Recurrent GI bleeding.  NUCLEAR MEDICINE GASTROINTESTINAL BLEEDING STUDY  Technique:  Sequential abdominal images were obtained following intravenous administration of Tc-5m labeled red blood cells.  Radiopharmaceutical: 20.1 mCi technetium 79m labeled red blood cells IV  Comparison: 02/18/2011  Findings: Normal distribution of the radiopharmaceutical.  There is no evidence of active extravasation, imaging extended over 2 hours.  IMPRESSION: Negative.  No evidence of active GI bleed.  Original Report Authenticated By: Osa Craver, M.D.   Nm Gi Blood Loss  02/18/2011  *RADIOLOGY REPORT*  Clinical Data: GI bleeding.  Hypotension.  Decreased hemoglobin.  NUCLEAR MEDICINE GASTROINTESTINAL BLEEDING STUDY  Technique:  Sequential abdominal images were obtained following intravenous administration of Tc-2m labeled red blood cells.  Radiopharmaceutical: 23.4 mCi of technetium 24m labeled red  blood cells.  Comparison: None.  Findings: Active GI bleeding is seen initially on the 5-minute image originating in the midportion of the transverse colon. Subsequent tracking of radiopharmaceutical activity to the splenic flexure is seen by the 60-minute image.  IMPRESSION: Positive for active GI bleeding originating in the midportion of the transverse colon.  Original Report Authenticated By: Danae Orleans, M.D.   Dg Chest Port 1 View  02/19/2011  *RADIOLOGY REPORT*  Clinical Data: Central line placement.  PORTABLE CHEST - 1 VIEW  Comparison: Chest 02/19/2011 8:52 a.m.  Findings: The patient has a new left IJ catheter with the tip in the lower superior vena cava.  No pneumothorax.  Lungs are clear. Heart size is normal.  No pleural fluid.  IMPRESSION: Left IJ catheter in good  position.  No pneumothorax or acute finding.  Original Report Authenticated By: Bernadene Bell. D'ALESSIO, M.D.     EKG: RSR, LAD.  No acute changes.  ASSESSMENT AND PLAN:   1. GIB:  Felt to be 2/2 diverticula.  Pts H/H and BP has stabilized.  We've been asked to address pts plavix rx however, pt has been on plavix since 2009, not b/c of a h/o CAD or stenting but b/c of a h/o CVA.  He subsequently had a TIA in 2010.  Defer any decisions re: reinstitution of plavix rx to neurology.  2. HTN: in setting of ABL anemia 2/2 #1, pts home antihypertensives have been appropriately held by IM.  BP has creeped up some but overall has been variable between the low 100's and 140's.  Defer resumption of oral antihypertensives to primary team.  3.  H/O CVA/TIA:  See discussion above.   Signed, Nicolasa Ducking, NP 02/23/2011, 11:31 AM   Attending Note:   The patient was seen and examined.  Agree with assessment and plan as noted above.  Pt and family are new to the area and have not set up any medical doctors.  They had requested a cardiologist to review meds and need for plavix.  His Plavix was started for a stroke.  I would defer to neurology as to whether he should be on that or on Aggrenox which is what they typically start now following a stroke.  His BP is well controlled.  No further recs.  Agree with plans by Int. Med. Will sign off . Call for questions.   Vesta Mixer, Montez Hageman., MD, [facility] 02/23/2011, 4:06 PM

## 2011-02-23 NOTE — Progress Notes (Signed)
Subjective: Patient with no bleeding this morning per nursing. Patient tolerating current diet. Patient c/o migraine headache, and covering his eyes. Objective: Vital signs in last 24 hours: Filed Vitals:   02/22/11 2300 02/23/11 0300 02/23/11 0342 02/23/11 0800  BP: 144/91  108/80   Pulse: 86 71 105   Temp: 99 F (37.2 C)  98 F (36.7 C) 98 F (36.7 C)  TempSrc: Oral  Oral Oral  Resp: 15 12 14    Height:      Weight:      SpO2: 99% 95% 97%     Intake/Output Summary (Last 24 hours) at 02/23/11 0918 Last data filed at 02/23/11 0840  Gross per 24 hour  Intake 1712.5 ml  Output   3275 ml  Net -1562.5 ml    Weight change:   General: Sleeping, eyes covered and c/o migraine headache.. Heart: Regular, rate, rhythm with no murmurs, rubs, gallops. Lungs: Clear to auscultation bilaterally anterior lung fields. Abdomen: Soft, nontender, nondistended, positive bowel sounds, , suprapubic discomfort. Extremities: No clubbing cyanosis or edema with positive pedal pulses. Neuro: Grossly intact, nonfocal.   Lab Results:  Basename 02/23/11 0500 02/22/11 0540 02/20/11 1845  NA 141 142 --  K 3.7 3.6 --  CL 111 113* --  CO2 26 24 --  GLUCOSE 109* 100* --  BUN 8 8 --  CREATININE 1.26 1.19 --  CALCIUM 9.5 8.8 --  MG 1.9 -- 2.0  PHOS 4.0 2.8 --    Basename 02/21/11 0600 02/20/11 1845  AST 15 --  ALT 16 --  ALKPHOS 29* --  BILITOT 0.3 --  PROT 4.0* --  ALBUMIN 2.3* 2.6*   No results found for this basename: LIPASE:2,AMYLASE:2 in the last 72 hours  Basename 02/23/11 0810 02/22/11 2000  WBC 7.6 8.4  NEUTROABS -- --  HGB 9.7* 9.3*  HCT 28.7* 27.3*  MCV 87.2 86.9  PLT 164 176    Basename 02/20/11 1845  CKTOTAL 103  CKMB 4.1*  CKMBINDEX --  TROPONINI <0.30   No results found for this basename: POCBNP:3 in the last 72 hours No results found for this basename: DDIMER:2 in the last 72 hours No results found for this basename: HGBA1C:2 in the last 72 hours No results  found for this basename: CHOL:2,HDL:2,LDLCALC:2,TRIG:2,CHOLHDL:2,LDLDIRECT:2 in the last 72 hours No results found for this basename: TSH,T4TOTAL,FREET3,T3FREE,THYROIDAB in the last 72 hours No results found for this basename: VITAMINB12:2,FOLATE:2,FERRITIN:2,TIBC:2,IRON:2,RETICCTPCT:2 in the last 72 hours  Micro Results: Recent Results (from the past 240 hour(s))  MRSA PCR SCREENING     Status: Normal   Collection Time   02/18/11 12:42 AM      Component Value Range Status Comment   MRSA by PCR NEGATIVE  NEGATIVE  Final   CULTURE, BLOOD (ROUTINE X 2)     Status: Normal (Preliminary result)   Collection Time   02/19/11  9:40 AM      Component Value Range Status Comment   Specimen Description BLOOD RIGHT ARM   Final    Special Requests NONE [facility] EACH   Final    Setup Time [id number]   Final    Culture     Final    Value:        BLOOD CULTURE RECEIVED NO GROWTH TO DATE CULTURE WILL BE HELD FOR 5 DAYS BEFORE ISSUING A FINAL NEGATIVE REPORT   Report Status PENDING   Incomplete   CULTURE, BLOOD (ROUTINE X 2)     Status: Normal (Preliminary result)   Collection  Time   02/19/11  9:47 AM      Component Value Range Status Comment   Specimen Description BLOOD RIGHT HAND   Final    Special Requests NONE BOTTLES DRAWN AEROBIC ONLY 1CC   Final    Setup Time [id number]   Final    Culture     Final    Value:        BLOOD CULTURE RECEIVED NO GROWTH TO DATE CULTURE WILL BE HELD FOR 5 DAYS BEFORE ISSUING A FINAL NEGATIVE REPORT   Report Status PENDING   Incomplete     Studies/Results: No results found.  Medications:    . antiseptic oral rinse  15 mL Mouth Rinse BID  . DULoxetine  60 mg Oral BID  . levothyroxine  100 mcg Oral QHS  . loratadine  10 mg Oral Daily  . pantoprazole  40 mg Oral Q0600  . polyvinyl alcohol  1 drop Both Eyes BID  . potassium chloride  40 mEq Oral Once  . simvastatin  20 mg Oral QPC supper  . Travoprost (BAK Free)  1 drop Both Eyes QHS     Assessment/Plan Principal Problem:  *Lower GI bleed Active Problems:  Hypotension  Leukocytosis  Chronic kidney disease, stage III (moderate)  Hypertension  Hypothyroidism  History of stroke Hypophosphatemia  1. Hypotension -  Improved.likely Secondary to GIB. Patient with no further bleeding today. brown liquid. Systolic blood pressure this morning in the 120s .leukocytosis improved.  blood cultures x2 pending. UA negative. Chest x-ray negative., lactic acid level WNL,  procalcitonin level negative , cardiac enzymes negative x3 . Responding to IVF and blood  transfusions. Will hold patient's finasteride.Follow. 2. GIB -  Unknown etiology, Likely secondary to LGIB secondary to bleeding in the transverse colon per first tagged red blood cell studyvs ischemic colitis from flex sig vs diverticular. Patient with no  Bleeding today. Patient is status post 7 units packed red blood cells. Patient's  Hgb is 9.7 this morning. Repeat tagged red blood cell scan was negative. Continue to hold aspirin and Plavix. Patient is s/p EGD which was negative, and flex sig was unable to  viewed secondary to blood in colon. Colonoscopy with mild diverticulosis, polyps and rectal ulcer. change CBC to every 12 hours. Gi ff and appreciate input and rxcs. Per GI family requesting cardiology consult for recommendations on resumption of ASA and Plavix. 3. CKD stage III- sTABLE RENAL FUNCTION IMPROVED . FOLLOW AND MONITOR FOR VOLUME OVERLOAD. 4. leukocytosis- likely reactive secondary to problem #2. Chest x-ray negative,  UA with cultures and sensitivities negative. Blood cultures pending.lactic acid level WNL,  procalcitonin level WNL. WBC trending down. Follow 5. HTN- BP meds on hold. IVF NSL.Per GI family requesting cardiology for BP medication management and when to resume. 6. Hx CVA - Plavix and ASA on hold secondary to #2. GI to determine when to resume. GI asking to get cardiology consult for when and if to resume  ASA and Plavix, and per family request. 7.Hypothyroidism - Synthroid. 8. Migraine HA - reglan and morphine sulphate prn. 8. Hypocalcemia -  corrected calcium is 9.0 as patient with albumin of 2.3. Follow. 9. Hypophosphatemia-repleted 10. Prophylaxis- SCDs for DVT. PPI for GI.    LOS: 6 days   Gregory Kline 02/23/2011, 9:18 AM

## 2011-02-23 NOTE — Progress Notes (Signed)
Patient ID: Gregory Kline, male   DOB: [birth year], 65 y.o.   MRN: [id number] Subjective: No acute events overnight.  Some minor twinges of abdominal discomfort at this time  Objective: Vital signs in last 24 hours: Temp:  [97.3 F (36.3 C)-99 F (37.2 C)] 98.1 F (36.7 C) (11/27 1200) Pulse Rate:  [71-106] 105  (11/27 0342) Resp:  [12-21] 14  (11/27 0342) BP: (108-148)/(80-93) 139/93 mmHg (11/27 1200) SpO2:  [95 %-100 %] 97 % (11/27 0342) Last BM Date: 02/21/11  Intake/Output from previous day: 11/26 0701 - 11/27 0700 In: 2180 [P.O.:1280; I.V.:900] Out: 2875 [Urine:2875] Intake/Output this shift: Total I/O In: -  Out: 1150 [Urine:1150]  General appearance: alert and no distress Resp: clear to auscultation bilaterally Cardio: regular rate and rhythm, S1, S2 normal, no murmur, click, rub or gallop GI: soft, non-tender; bowel sounds normal; no masses,  no organomegaly Extremities: extremities normal, atraumatic, no cyanosis or edema  Lab Results:  Basename 02/23/11 0810 02/22/11 2000 02/22/11 0540  WBC 7.6 8.4 8.8  HGB 9.7* 9.3* 9.2*  HCT 28.7* 27.3* 26.8*  PLT 164 176 146*   BMET  Basename 02/23/11 0500 02/22/11 0540 02/21/11 0600  NA 141 142 142  K 3.7 3.6 3.3*  CL 111 113* 114*  CO2 26 24 22   GLUCOSE 109* 100* 101*  BUN 8 8 12   CREATININE 1.26 1.19 1.16  CALCIUM 9.5 8.8 7.6*   LFT  Basename 02/21/11 0600  PROT 4.0*  ALBUMIN 2.3*  AST 15  ALT 16  ALKPHOS 29*  BILITOT 0.3  BILIDIR --  IBILI --   PT/INR No results found for this basename: LABPROT:2,INR:2 in the last 72 hours Hepatitis Panel No results found for this basename: HEPBSAG,HCVAB,HEPAIGM,HEPBIGM in the last 72 hours C-Diff No results found for this basename: CDIFFTOX:3 in the last 72 hours Fecal Lactopherrin No results found for this basename: FECLLACTOFRN in the last 72 hours  Studies/Results: No results found.  Medications:  Scheduled:   . antiseptic oral rinse  15 mL Mouth Rinse  BID  . DULoxetine  60 mg Oral BID  . levothyroxine  100 mcg Oral QHS  . loratadine  10 mg Oral Daily  . pantoprazole  40 mg Oral Q0600  . polyvinyl alcohol  1 drop Both Eyes BID  . simvastatin  20 mg Oral QPC supper  . Travoprost (BAK Free)  1 drop Both Eyes QHS   Continuous:   . DISCONTD: sodium chloride 75 mL/hr at 02/22/11 7829    Assessment/Plan: 1) Diverticular bleed 2) History of stroke   Overall the patient is stable from the GI standpoint.  No further bleeding at this time.  Apparently there is some confusion about consultations.  Cardiology was consulted to address the issue of Plavix and ASA, but they do not feel that they have anything to offer.  They are requesting a Neurology consultation to address the anticoagulation issue.  From the GI standpoint, he will always be at high risk for rebleed.  It is easier to manage a GI bleed than to treat a stroke, relatively speaking.  At the very least, I think he should be on ASA, but I do not know about the combination of ASA/Plavix and the risk stratification for stroke.  Also, at this time, I do not feel that he requires a capsule endo.  If the bleeding recurs and there is the need to consider a colectomy, than a capsule endoscopy will be warranted.  The RBC scan on admission  isolated the site to the transverse colon.  LOS: 6 days   Gregory Kline D 02/23/2011, 2:17 PM

## 2011-02-23 NOTE — Consult Note (Signed)
Reason for Consult risk benefit off antiplatelet therapy in the stroke patient in the setting of recent GI hemorrhage Referring Physician:Patrick HungMark A Councilman is an 65 y.o. male.  HPI: Mr Lezotte is a 65 year old Caucasian gentleman admitted with lower GI bleeding. He was on aspirin and Plavix which have been held and we have been consulted to comment about risk-benefit of starting antiplatelet therapy in the setting of acute GI hemorrhage in this patient with history of strokes. Patient is unable to provide detailed history secondary to his baseline speech difficulties and decreased hearing. I spoke to patient's wife over the telephone who provided a history. 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 He has residual dysarthria gait imbalance from these. He was started initially on aspirin and subsequently Plavix was added for recurrent TIAs. 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 He has bilateral decreased hearing and gait and balance problems at baseline.he was living in [place] and recently moved to [place].  Past Medical History  Diagnosis Date  . CVA (cerebral infarction)     on asa/plavix  . Hydrocephalus     with shunt  . Hypothyroid   . Diverticulosis   . Depression   . Restless leg syndrome   . Hyperlipemia   . Hypertension   . Horseshoe kidney   . Renal cell carcinoma     Past Surgical History  Procedure Date  . Thyroidectomy, partial   . Kidney surgery   . Partial colectomy   . Parathyroidectomy   . Cervical spine surgery     benign tumor  . Esophagogastroduodenoscopy 02/19/2011    Procedure: ESOPHAGOGASTRODUODENOSCOPY (EGD);  Surgeon: Charna Elizabeth, MD;  Location: [facility];  Service: Endoscopy;  Laterality: N/A;  . Flexible sigmoidoscopy 02/19/2011    Procedure: FLEXIBLE  SIGMOIDOSCOPY;  Surgeon: Charna Elizabeth, MD;  Location: [facility];  Service: Endoscopy;  Laterality: N/A;  . Colonoscopy 02/21/2011    Procedure: COLONOSCOPY;  Surgeon: Hart Carwin, MD;  Location: [facility];  Service: Endoscopy;  Laterality: N/A;  . Polypectomy 02/21/2011    Procedure: POLYPECTOMY;  Surgeon: Hart Carwin, MD;  Location: [facility];  Service: Endoscopy;  Laterality: Left;    Family History  Problem Relation Age of Onset  . Cancer Mother   . Leukemia Father     Social History:  reports that he has quit smoking. He has never used smokeless tobacco. He reports that he drinks about .5 ounces of alcohol per week. He reports that he does not use illicit drugs.  Allergies:  Allergies  Allergen Reactions  . Cafergot Rash  . Celebrex (Celecoxib) Rash  . Latex Rash    Sensitivity per pt  . Sulfa Antibiotics Rash    Medications: I have reviewed the patient's current medications.  Results for orders placed during the hospital encounter of 02/17/11 (from the past 48 hour(s))  CBC     Status: Abnormal   Collection Time   02/21/11 11:20 PM      Component Value Range Comment   WBC 8.4  4.0 - 10.5 (K/uL)    RBC 2.93 (*) 4.22 - 5.81 (MIL/uL)    Hemoglobin 8.7 (*) 13.0 - 17.0 (g/dL)    HCT 16.1 (*) 09.6 - 52.0 (%)    MCV 86.3  78.0 - 100.0 (fL)    MCH 29.7  26.0 - 34.0 (pg)    MCHC 34.4  30.0 - 36.0 (g/dL)    RDW 40.9 (*) 81.1 - 15.5 (%)    Platelets 133 (*) 150 - 400 (K/uL)   PHOSPHORUS     Status: Normal   Collection Time   02/22/11  5:40 AM      Component Value Range Comment   Phosphorus 2.8  2.3 - 4.6 (mg/dL)   BASIC METABOLIC PANEL     Status: Abnormal   Collection Time   02/22/11  5:40 AM      Component Value Range Comment   Sodium 142  135 - 145 (mEq/L)    Potassium 3.6  3.5 - 5.1 (mEq/L)    Chloride 113 (*) 96 - 112 (mEq/L)    CO2 24  19 - 32 (mEq/L)    Glucose, Bld 100 (*) 70 - 99 (mg/dL)    BUN 8  6 - 23 (mg/dL)    Creatinine, Ser 9.14  0.50 -  1.35 (mg/dL)    Calcium 8.8  8.4 - 10.5 (mg/dL)    GFR calc non Af Amer 61 (*) >90 (mL/min)    GFR calc Af Amer 71 (*) >90 (mL/min)   CBC     Status: Abnormal   Collection Time   02/22/11  5:40 AM      Component Value Range Comment   WBC 8.8  4.0 - 10.5 (K/uL)    RBC 3.09 (*) 4.22 - 5.81 (MIL/uL)    Hemoglobin 9.2 (*) 13.0 - 17.0 (g/dL)    HCT 78.2 (*) 95.6 - 52.0 (%)    MCV 86.7  78.0 - 100.0 (fL)    MCH 29.8  26.0 - 34.0 (pg)    MCHC 34.3  30.0 - 36.0 (g/dL)    RDW 21.3 (*) 08.6 - 15.5 (%)    Platelets 146 (*) 150 - 400 (K/uL)   CBC     Status: Abnormal   Collection Time   02/22/11  8:00 PM      Component Value Range Comment   WBC 8.4  4.0 - 10.5 (K/uL)    RBC 3.14 (*) 4.22 - 5.81 (MIL/uL)    Hemoglobin 9.3 (*) 13.0 - 17.0 (g/dL)    HCT 57.8 (*) 46.9 - 52.0 (%)    MCV 86.9  78.0 - 100.0 (fL)    MCH 29.6  26.0 - 34.0 (pg)    MCHC 34.1  30.0 - 36.0 (g/dL)    RDW 62.9 (*) 52.8 - 15.5 (%)    Platelets 176  150 - 400 (K/uL)   PHOSPHORUS     Status: Normal   Collection Time   02/23/11  5:00 AM      Component Value Range Comment   Phosphorus 4.0  2.3 - 4.6 (mg/dL)   BASIC METABOLIC PANEL     Status: Abnormal   Collection Time   02/23/11  5:00 AM      Component Value Range Comment   Sodium 141  135 - 145 (mEq/L)    Potassium 3.7  3.5 - 5.1 (mEq/L)    Chloride 111  96 - 112 (mEq/L)    CO2 26  19 - 32 (mEq/L)    Glucose, Bld 109 (*) 70 - 99 (mg/dL)    BUN 8  6 - 23 (mg/dL)    Creatinine, Ser 4.13  0.50 - 1.35 (mg/dL)    Calcium 9.5  8.4 - 10.5 (mg/dL)  GFR calc non Af Amer 57 (*) >90 (mL/min)    GFR calc Af Amer 66 (*) >90 (mL/min)   MAGNESIUM     Status: Normal   Collection Time   02/23/11  5:00 AM      Component Value Range Comment   Magnesium 1.9  1.5 - 2.5 (mg/dL)   CBC     Status: Abnormal   Collection Time   02/23/11  8:10 AM      Component Value Range Comment   WBC 7.6  4.0 - 10.5 (K/uL)    RBC 3.29 (*) 4.22 - 5.81 (MIL/uL)    Hemoglobin 9.7 (*) 13.0 -  17.0 (g/dL)    HCT 16.1 (*) 09.6 - 52.0 (%)    MCV 87.2  78.0 - 100.0 (fL)    MCH 29.5  26.0 - 34.0 (pg)    MCHC 33.8  30.0 - 36.0 (g/dL)    RDW 04.5 (*) 40.9 - 15.5 (%)    Platelets 164  150 - 400 (K/uL)   CBC     Status: Abnormal   Collection Time   02/23/11  4:45 PM      Component Value Range Comment   WBC 7.9  4.0 - 10.5 (K/uL)    RBC 3.26 (*) 4.22 - 5.81 (MIL/uL)    Hemoglobin 9.6 (*) 13.0 - 17.0 (g/dL)    HCT 81.1 (*) 91.4 - 52.0 (%)    MCV 87.1  78.0 - 100.0 (fL)    MCH 29.4  26.0 - 34.0 (pg)    MCHC 33.8  30.0 - 36.0 (g/dL)    RDW 78.2 (*) 95.6 - 15.5 (%)    Platelets 184  150 - 400 (K/uL)     No results found. No brain imaging studies availablel ROS positive for GI bleeding, gait imbalance, decreased hearing, speech difficulties. Blood pressure 129/89, pulse 92, temperature 97.6 F (36.4 C), temperature source Oral, resp. rate 18, height 5\' 9"  (1.753 m), weight 91 kg (200 lb 9.9 oz), SpO2 97.00%. PHYSEXAM present elderly Caucasian gentleman not in distress. Afebrile. Neck is supple without bruit. Ventriculoperitoneal shunt noted on the right side of the neck. Decreased hearing despite bilateral hearing aids. Cardiac exam no murmur or gallop. Lungs clear to auscultation.  Neurological exam awake alert oriented to time place and person. Speech is slow and hesitant with word finding difficulties. There is no dysarthria. Diminished short-term memory attention and recall. Eye movements are full range without nystagmus. Face is symmetric without weakness.Decreased hearing bilaterally. Tongue is midline. Motor system exam reveals no upper or lower extremity drift. Symmetric and equal strength in all 4 extremities. Diminished fine finger movements slightly on the left. Orbits right or left upper extremity. Deep tendon differences are brisk and symmetric. Plantars are both downgoing. Touch pinprick sensation are intact bilaterally. Gait was not tested.  Assessment : 14 year Caucasian  gentleman with remote history  of brainstem stroke and TIAs as well as Congenital hydrocephalus status post ventricular peritoneal shunt who has had recent lower GI bleeding on combination antiplatelet therapy of aspirin and Plavix.  Plan I discussed in detail with the patient and his wife the risk of recurrent strokes being 4-5% per year and the risk is probably higher on Plavix or combination of aspirin and Plavix hence I would recommend he start aspirin 81 mg a day alone and never go back on combination therapy as there is no evidence to justify long-term use of aspirin and Plavix for secondary stroke prevention. Since he  has not had any further strokes or TIAs involving 2 years had do not believe any further stroke testing is indicated at the current time. He may follow up electively with me as an outpatient for stroke followup and memory evaluation if needed. Kindly call for questions. We will sign off    Christyana Corwin,PRAMODKUMAR P 02/23/2011, 9:38 PM

## 2011-02-23 NOTE — Progress Notes (Signed)
VSS throughout shift. Hgb 9.2-9.3 in last 24 hours, no active s/o bleeding noted. Voiding per urinal q 1-2 hours clear lt yellow urine. No c/o pain.

## 2011-02-24 LAB — VITAMIN D 1,25 DIHYDROXY
Vitamin D 1, 25 (OH)2 Total: 93 pg/mL — ABNORMAL HIGH (ref 18–72)
Vitamin D2 1, 25 (OH)2: 10 pg/mL

## 2011-02-24 LAB — BASIC METABOLIC PANEL
BUN: 13 mg/dL (ref 6–23)
Calcium: 9.8 mg/dL (ref 8.4–10.5)
Creatinine, Ser: 1.37 mg/dL — ABNORMAL HIGH (ref 0.50–1.35)
GFR calc non Af Amer: 52 mL/min — ABNORMAL LOW (ref >90)
Glucose, Bld: 113 mg/dL — ABNORMAL HIGH (ref 70–99)

## 2011-02-24 LAB — CBC
HCT: 28.8 % — ABNORMAL LOW (ref 39.0–52.0)
Hemoglobin: 9.5 g/dL — ABNORMAL LOW (ref 13.0–17.0)
MCH: 28.8 pg (ref 26.0–34.0)
MCHC: 33 g/dL (ref 30.0–36.0)
RDW: 17 % — ABNORMAL HIGH (ref 11.5–15.5)

## 2011-02-24 MED ORDER — FINASTERIDE 5 MG PO TABS
5.0000 mg | ORAL_TABLET | Freq: Every day | ORAL | Status: DC
  Administered 2011-02-24 – 2011-02-25 (×2): 5 mg via ORAL
  Filled 2011-02-24 (×2): qty 1

## 2011-02-24 MED ORDER — ASPIRIN 81 MG PO CHEW: 81.0000 mg | CHEWABLE_TABLET | Freq: Every day | ORAL | Status: DC

## 2011-02-24 MED ORDER — DOCUSATE SODIUM 100 MG PO CAPS
100.0000 mg | ORAL_CAPSULE | Freq: Two times a day (BID) | ORAL | Status: DC
  Administered 2011-02-24 – 2011-02-25 (×2): 100 mg via ORAL
  Filled 2011-02-24 (×3): qty 1

## 2011-02-24 NOTE — Progress Notes (Signed)
Subjective: Patient  complaining of constipation, he denies any further bleeding, has not had any bowel movement since his colonoscopy. Objective: Vital signs in last 24 hours: Filed Vitals:   02/23/11 1600 02/23/11 1711 02/23/11 2158 02/24/11 0546  BP: 139/88 129/89 122/72 119/73  Pulse:  92 93 94  Temp:  97.6 F (36.4 C) 98 F (36.7 C) 97.6 F (36.4 C)  TempSrc: Oral Oral Oral Oral  Resp:  18 20 18   Height:      Weight:      SpO2:  97% 96% 96%    Intake/Output Summary (Last 24 hours) at 02/24/11 1224 Last data filed at 02/24/11 0500  Gross per 24 hour  Intake    480 ml  Output   2225 ml  Net  -1745 ml    Weight change:   General: Alert, in no apparent distress. Heart: Regular, rate, rhythm with no murmurs, rubs, gallops. Lungs: Clear to auscultation bilaterally anterior lung fields. Abdomen: Soft, nontender, nondistended, positive bowel sounds, no masses palpable. Extremities: No clubbing cyanosis or edema with positive pedal pulses. Neuro: Grossly intact, nonfocal.   Lab Results:  Basename 02/24/11 0550 02/23/11 0500 02/22/11 0540  NA 142 141 --  K 3.7 3.7 --  CL 106 111 --  CO2 28 26 --  GLUCOSE 113* 109* --  BUN 13 8 --  CREATININE 1.37* 1.26 --  CALCIUM 9.8 9.5 --  MG -- 1.9 --  PHOS -- 4.0 2.8   No results found for this basename: AST:2,ALT:2,ALKPHOS:2,BILITOT:2,PROT:2,ALBUMIN:2 in the last 72 hours No results found for this basename: LIPASE:2,AMYLASE:2 in the last 72 hours  Basename 02/24/11 0550 02/23/11 1645  WBC 8.1 7.9  NEUTROABS -- --  HGB 9.5* 9.6*  HCT 28.8* 28.4*  MCV 87.3 87.1  PLT 197 184   No results found for this basename: CKTOTAL:3,CKMB:3,CKMBINDEX:3,TROPONINI:3 in the last 72 hours No results found for this basename: POCBNP:3 in the last 72 hours No results found for this basename: DDIMER:2 in the last 72 hours No results found for this basename: HGBA1C:2 in the last 72 hours No results found for this basename:  CHOL:2,HDL:2,LDLCALC:2,TRIG:2,CHOLHDL:2,LDLDIRECT:2 in the last 72 hours No results found for this basename: TSH,T4TOTAL,FREET3,T3FREE,THYROIDAB in the last 72 hours No results found for this basename: VITAMINB12:2,FOLATE:2,FERRITIN:2,TIBC:2,IRON:2,RETICCTPCT:2 in the last 72 hours  Micro Results: Recent Results (from the past 240 hour(s))  MRSA PCR SCREENING     Status: Normal   Collection Time   02/18/11 12:42 AM      Component Value Range Status Comment   MRSA by PCR NEGATIVE  NEGATIVE  Final   CULTURE, BLOOD (ROUTINE X 2)     Status: Normal (Preliminary result)   Collection Time   02/19/11  9:40 AM      Component Value Range Status Comment   Specimen Description BLOOD RIGHT ARM   Final    Special Requests NONE 5CC EACH   Final    Setup Time [id number]   Final    Culture     Final    Value:        BLOOD CULTURE RECEIVED NO GROWTH TO DATE CULTURE WILL BE HELD FOR 5 DAYS BEFORE ISSUING A FINAL NEGATIVE REPORT   Report Status PENDING   Incomplete   CULTURE, BLOOD (ROUTINE X 2)     Status: Normal (Preliminary result)   Collection Time   02/19/11  9:47 AM      Component Value Range Status Comment   Specimen Description BLOOD RIGHT HAND  Final    Special Requests NONE BOTTLES DRAWN AEROBIC ONLY 1CC   Final    Setup Time [id number]   Final    Culture     Final    Value:        BLOOD CULTURE RECEIVED NO GROWTH TO DATE CULTURE WILL BE HELD FOR 5 DAYS BEFORE ISSUING A FINAL NEGATIVE REPORT   Report Status PENDING   Incomplete     Studies/Results: No results found.  Medications:    . antiseptic oral rinse  15 mL Mouth Rinse BID  . aspirin  81 mg Oral Daily  . DULoxetine  60 mg Oral BID  . levothyroxine  100 mcg Oral QHS  . loratadine  10 mg Oral Daily  . pantoprazole  40 mg Oral Q0600  . polyvinyl alcohol  1 drop Both Eyes BID  . simvastatin  20 mg Oral QPC supper  . Travoprost (BAK Free)  1 drop Both Eyes QHS    Assessment/Plan Principal Problem:  *Lower GI  bleed Active Problems:  Hypotension  Leukocytosis  Chronic kidney disease, stage III (moderate)  Hypertension  Hypothyroidism  History of stroke Hypophosphatemia  1. Hypotension - resolved, secondary to a GI bleed, other workup negative  2. GIB - unclear etiology, but per GI likely diverticular, initial tagged red scan showed bleeding in the transverse  red blood cell study. Patient has had  no  Bleeding. Patient is status post 7 units packed red blood cells. Patient's  Hgb is stable at 9.6 this morning. Repeat tagged red blood cell scan was negative. Will restart aspirin only as per GI and neuro recommendations given his risk for strokes. Patient is s/p EGD which was negative, and flex sig was unable to  viewed secondary to blood in colon. Colonoscopy with mild diverticulosis, polyps and rectal ulcer.  -Will monitor overnight for any bleeding on the aspirin, recheck hemoglobin in a.m. and if remaining stable will plan discharge for outpatient followup tomorrow. 3. CKD stage III-  stable .  4. leukocytosis- resolved, likely reactive secondary to problem #2. Chest x-ray negative,  UA with cultures and sensitivities negative. Blood cultures pending.lactic acid level WNL,  procalcitonin level WNL. WBC trending down. Follow 5. HTN- BP controlled 6. Hx CVA -  appreciate neuro input, aspirin are resumed as above. 7.Hypothyroidism - Synthroid. 8. Migraine HA - reglan and morphine sulphate prn. 8. Hypocalcemia -  corrected calcium is 9.0 as patient with albumin of 2.3. Follow. 9. Hypophosphatemia-repleted 10. Prophylaxis- SCDs for DVT. PPI for GI. 11. Constipation-will start patient on Colace and follow.   LOS: 7 days   Demaya Hardge C 02/24/2011, 12:24 PM

## 2011-02-25 LAB — CBC
Hemoglobin: 9.9 g/dL — ABNORMAL LOW (ref 13.0–17.0)
MCH: 29.1 pg (ref 26.0–34.0)
MCV: 88.5 fL (ref 78.0–100.0)
RBC: 3.4 MIL/uL — ABNORMAL LOW (ref 4.22–5.81)

## 2011-02-25 LAB — CULTURE, BLOOD (ROUTINE X 2)
Culture  Setup Time: 201211231402
Culture: NO GROWTH

## 2011-02-25 MED ORDER — ASPIRIN 81 MG PO CHEW: 81.0000 mg | CHEWABLE_TABLET | Freq: Every day | ORAL | Status: AC

## 2011-02-25 MED ORDER — DSS 100 MG PO CAPS: 100.0000 mg | ORAL_CAPSULE | Freq: Two times a day (BID) | ORAL | Status: AC

## 2011-02-25 MED ORDER — PANTOPRAZOLE SODIUM 40 MG PO TBEC: 40.0000 mg | DELAYED_RELEASE_TABLET | Freq: Every day | ORAL | Status: DC

## 2011-02-25 NOTE — Progress Notes (Signed)
Pt d/c'd home. No changes since am assessment. Pt and wife verbalized understanding of home meds and discharge info. Handouts were given and all questions were answered and prescriptions were given.   William Dalton Lajoi 02/25/2011 2:57 PM

## 2011-02-25 NOTE — Discharge Summary (Signed)
Name: Gregory Kline MRN: [id number] DOB: January 08, 1946 65 y.o.  Date of Admission: 02/17/2011  3:46 PM Date of Discharge: 02/25/2011 Attending Physician: Kela Millin  Discharge Diagnosis: Principal Problem:  *Lower GI bleed - likely diverticular per GI. Active Problems: H/o Chronic kidney disease, stage III (moderate)- last creatinine 1.37 with BUN of 13 prior to discharge  Hypertension  Hypothyroidism  History of stroke  Hypotension  Leukocytosis  Hypophosphatemia  Migraine headache   Discharge Medications: Current Discharge Medication List    START taking these medications   Details  aspirin 81 MG chewable tablet Chew 1 tablet (81 mg total) by mouth daily. Qty: 30 tablet, Refills: 0    docusate sodium 100 MG CAPS Take 100 mg by mouth 2 (two) times daily. Qty: 30 capsule, Refills: 0    pantoprazole (PROTONIX) 40 MG tablet Take 1 tablet (40 mg total) by mouth daily at 6 (six) AM. Qty: 30 tablet, Refills: 0      CONTINUE these medications which have NOT CHANGED   Details  atorvastatin (LIPITOR) 20 MG tablet Take 40 mg by mouth daily.     dextran 70-hypromellose (TEARS RENEWED) ophthalmic solution Place 1 drop into both eyes 2 (two) times daily.      DULoxetine (CYMBALTA) 60 MG capsule Take 60 mg by mouth 2 (two) times daily.      finasteride (PROSCAR) 5 MG tablet Take 5 mg by mouth daily.     levothyroxine (SYNTHROID, LEVOTHROID) 100 MCG tablet Take 100 mcg by mouth at bedtime.     loratadine (CLARITIN REDITABS) 10 MG dissolvable tablet Take 10 mg by mouth as needed. For eyes dryness and itching    losartan-hydrochlorothiazide (HYZAAR) 100-25 MG per tablet Take 1 tablet by mouth daily.      pramipexole (MIRAPEX) 0.5 MG tablet Take 0.5 mg by mouth 1 day or 1 dose.      risedronate (ACTONEL) 35 MG tablet Take 35 mg by mouth every 7 (seven) days. with water on empty stomach, nothing by mouth or lie down for next 30 minutes.takes on sunday    simvastatin  (ZOCOR) 20 MG tablet Take 20 mg by mouth at bedtime.      Travoprost, BAK Free, (TRAVATAN) 0.004 % SOLN ophthalmic solution Place 1 drop into both eyes at bedtime.      acetaminophen-codeine (TYLENOL #3) 300-30 MG per tablet Take 1 tablet by mouth every 4 (four) hours as needed.      almotriptan (AXERT) 12.5 MG tablet Take 12.5 mg by mouth as needed. may repeat in 2 hours if needed       STOP taking these medications     azithromycin (ZITHROMAX) 500 MG tablet      clopidogrel (PLAVIX) 75 MG tablet      aspirin 325 MG tablet      diclofenac sodium (VOLTAREN) 1 % GEL         Disposition and follow-up:   [name] was discharged from [facility] in improved/stable condition.    Follow-up Appointments: Discharge Orders    Future Orders Please Complete By Expires   Diet general      Increase activity slowly      Call MD for:  extreme fatigue      Comments:   Call [facility] orPCP if any bleeding   Call MD for:  persistant dizziness or light-headedness         Consultations: Treatment Team:  [facility] - Dr Pearlean Brownie  Procedures Performed:  Nm Gi Blood Loss  02/20/2011  *RADIOLOGY REPORT*  Clinical Data: Recurrent GI bleeding.  NUCLEAR MEDICINE GASTROINTESTINAL BLEEDING STUDY  Technique:  Sequential abdominal images were obtained following intravenous administration of Tc-93m labeled red blood cells.  Radiopharmaceutical: 20.1 mCi technetium 15m labeled red blood cells IV  Comparison: 02/18/2011  Findings: Normal distribution of the radiopharmaceutical.  There is no evidence of active extravasation, imaging extended over 2 hours.  IMPRESSION: Negative.  No evidence of active GI bleed.  Original Report Authenticated By: Osa Craver, M.D.   Nm Gi Blood Loss  02/18/2011  *RADIOLOGY REPORT*  Clinical Data: GI bleeding.  Hypotension.  Decreased hemoglobin.  NUCLEAR MEDICINE GASTROINTESTINAL BLEEDING STUDY  Technique:  Sequential abdominal  images were obtained following intravenous administration of Tc-33m labeled red blood cells.  Radiopharmaceutical: 23.4 mCi of technetium 30m labeled red blood cells.  Comparison: None.  Findings: Active GI bleeding is seen initially on the 5-minute image originating in the midportion of the transverse colon. Subsequent tracking of radiopharmaceutical activity to the splenic flexure is seen by the 60-minute image.  IMPRESSION: Positive for active GI bleeding originating in the midportion of the transverse colon.  Original Report Authenticated By: Danae Orleans, M.D.   Dg Chest Port 1 View  02/19/2011  *RADIOLOGY REPORT*  Clinical Data: Central line placement.  PORTABLE CHEST - 1 VIEW  Comparison: Chest 02/19/2011 8:52 a.m.  Findings: The patient has a new left IJ catheter with the tip in the lower superior vena cava.  No pneumothorax.  Lungs are clear. Heart size is normal.  No pleural fluid.  IMPRESSION: Left IJ catheter in good position.  No pneumothorax or acute finding.  Original Report Authenticated By: Bernadene Bell. Maricela Curet, M.D.   Dg Chest Port 1 View  02/19/2011  *RADIOLOGY REPORT*  Clinical Data: Leukocytosis.  GI bleed.  Hypotension.  PORTABLE CHEST - 1 VIEW  Comparison: None.  Findings: Heart size and vascularity are normal and the lungs are clear.  No osseous abnormality.  IMPRESSION: Normal chest.  Original Report Authenticated By: Gwynn Burly, M.D.   EGD on 11/23 - normal, no ulcers or polyps  Colonoscopy on 11/25 - mild diverticulosis, multiple polyps, ulcer in the rectum, otherwise within normal limits. No active bleeding.  Brief history Patient is a 65 year old man who presented with a history of painless lower GI bleeding. He he stated he occasionally has a little bit of blood with stools which he has attributed to hemorrhoids recently had frank bleeding before. Today he had 4 discrete episodes of voluminous bright red blood per rectum. A history of diverticulosis was noted and  that he last had colonoscopy approximately one to 2 years ago in [place]. He had had no bleeding  in the emergency department however he was noted be hypotensive and in discussion with emergency department physician it is felt the patient should be amended to step down unit. Workup was notable for a creatinine 2.05 and a leukocytosis of 15.2. Hemoglobin was unremarkable. EKG was unremarkable. Gastroenterology is ready been consulted by the emergency department. He was admitted for further evaluation and management.  Hospital Course by problem list: Principal Problem:  *Lower GI bleed- likely diverticular per GI.  -As discussed above upon admission serial CBCs with and patient was transfused as appropriate-he required a total of 7 units of packed red blood cells transfused while in the hospital. Gastroenterology was consulted and a bleeding scan was ordered initially  and it was positive for  active GI bleeding originating in the midportion of the transverse colon, interventional radiology was unable to do any interventions immediately following that  Study, Dr. Loreta Ave followed up with the page and then EGD was done on 11/23 which was negative. Subsequently a followup tagged red cell scan was done on 02/20/2011 but that came back negative then. Gastroenterology followed and a colonoscopy was done on a 11/25 and the results as stated above with mild diverticulosis, polyps and and an ulcer in the rectum. Following this GI recommended to start patient on a and monitor his hemoglobins. The patient had been hypotensive upon admission was aggressively volume resuscitated and this resolved, other workup for his hypotension was negative. The patient had been on aspirin and Plavix prior to admission and this were discontinued while in the hospital. After his GI bleed resolved and his hemoglobins on followup were remaining stable neurology was consulted him regarding resumption of  the antiplatelet agents for stroke  prevention, after GI indicated from his standpoint to go ahead. Dr. Pearlean Brownie saw the patient and recommended starting him on aspirin 81mg , and to discontinue the Plavix and patient has been instructed not to take any further plavix upon discharge. As ready mentioned above his hemoglobin arm has been stable arm, he's not had any further no gross bleeding, he's not had a bowel movement at. I discussed this with Dr. Loreta Ave and she agreed with a stool softener and  discharging patient home and for outpatient followup. The GI impression was that the he is  bleed was likely diverticular. Acute on probable Chronic kidney disease, stage III (moderate) His creatinine improved with hydration to 1.37 and prior to discharge he is to follow up outpatient.  Hypertension -His antihypertensives were held and his the hospital due to #1, he is to resume his outpatient medications upon discharge since his blood pressures have been remaining stable. His to followup with his primary care physician for further monitoring of his  blood pressures he has well as creatinine and he may need to have his Lorsatan /HCTZ change to another a blood pressure medication if his creatinine is worsening on it  Hypothyroidism His to continue his Synthroid upon discharge   History of stroke -Patient has been restarted on aspirin 81 mg as per neuro recommendations the  Hypotension - resolvedas discussed above  Leukocytosis -Resolved, the impression was that this was likely reactive, workup for infection was negative.  Migraine headache -Patient is to continue his outpatient medications upon Hypophosphatemia -His phosphate was repleted in the hospital Discharge Vitals:  BP 119/70  Pulse 91  Temp(Src) 97.6 F (36.4 C) (Oral)  Resp 20  Ht 5\' 9"  (1.753 m)  Wt 91 kg (200 lb 9.9 oz)  BMI 29.63 kg/m2  SpO2 98%  Discharge Labs:  Results for orders placed during the hospital encounter of 02/17/11 (from the past 24 hour(s))  CBC     Status:  Abnormal   Collection Time   02/25/11  5:35 AM      Component Value Range   WBC 8.5  4.0 - 10.5 (K/uL)   RBC 3.40 (*) 4.22 - 5.81 (MIL/uL)   Hemoglobin 9.9 (*) 13.0 - 17.0 (g/dL)   HCT 45.4 (*) 09.8 - 52.0 (%)   MCV 88.5  78.0 - 100.0 (fL)   MCH 29.1  26.0 - 34.0 (pg)   MCHC 32.9  30.0 - 36.0 (g/dL)   RDW 11.9 (*) 14.7 - 15.5 (%)   Platelets 242  150 - 400 (K/uL)  SignedKela Millin 02/25/2011, 12:19 PM

## 2012-01-31 ENCOUNTER — Ambulatory Visit: Payer: Federal, State, Local not specified - PPO | Attending: Internal Medicine

## 2012-01-31 DIAGNOSIS — M25519 Pain in unspecified shoulder: Secondary | ICD-10-CM | POA: Insufficient documentation

## 2012-01-31 DIAGNOSIS — IMO0001 Reserved for inherently not codable concepts without codable children: Secondary | ICD-10-CM | POA: Insufficient documentation

## 2012-01-31 DIAGNOSIS — M25619 Stiffness of unspecified shoulder, not elsewhere classified: Secondary | ICD-10-CM | POA: Insufficient documentation

## 2012-01-31 DIAGNOSIS — R5381 Other malaise: Secondary | ICD-10-CM | POA: Insufficient documentation

## 2012-02-02 ENCOUNTER — Ambulatory Visit: Payer: Federal, State, Local not specified - PPO | Admitting: Physical Therapy

## 2012-02-07 ENCOUNTER — Ambulatory Visit: Payer: Federal, State, Local not specified - PPO | Admitting: Physical Therapy

## 2012-02-09 ENCOUNTER — Ambulatory Visit: Payer: Federal, State, Local not specified - PPO | Admitting: Physical Therapy

## 2012-02-14 ENCOUNTER — Ambulatory Visit: Payer: Federal, State, Local not specified - PPO | Admitting: Physical Therapy

## 2012-02-16 ENCOUNTER — Ambulatory Visit: Payer: Federal, State, Local not specified - PPO | Admitting: Physical Therapy

## 2012-02-21 ENCOUNTER — Ambulatory Visit: Payer: Federal, State, Local not specified - PPO | Admitting: Physical Therapy

## 2012-02-28 ENCOUNTER — Ambulatory Visit: Payer: Federal, State, Local not specified - PPO | Attending: Internal Medicine | Admitting: Physical Therapy

## 2012-02-28 DIAGNOSIS — R5381 Other malaise: Secondary | ICD-10-CM | POA: Insufficient documentation

## 2012-02-28 DIAGNOSIS — IMO0001 Reserved for inherently not codable concepts without codable children: Secondary | ICD-10-CM | POA: Insufficient documentation

## 2012-02-28 DIAGNOSIS — M25519 Pain in unspecified shoulder: Secondary | ICD-10-CM | POA: Insufficient documentation

## 2012-03-01 ENCOUNTER — Ambulatory Visit: Payer: Federal, State, Local not specified - PPO | Admitting: Physical Therapy

## 2012-08-07 DIAGNOSIS — K4020 Bilateral inguinal hernia, without obstruction or gangrene, not specified as recurrent: Secondary | ICD-10-CM

## 2012-08-07 HISTORY — DX: Bilateral inguinal hernia, without obstruction or gangrene, not specified as recurrent: K40.20

## 2012-11-06 DIAGNOSIS — G4733 Obstructive sleep apnea (adult) (pediatric): Secondary | ICD-10-CM

## 2012-11-06 HISTORY — DX: Obstructive sleep apnea (adult) (pediatric): G47.33

## 2014-08-05 DIAGNOSIS — E611 Iron deficiency: Secondary | ICD-10-CM | POA: Insufficient documentation

## 2014-08-05 HISTORY — DX: Iron deficiency: E61.1

## 2015-04-29 DIAGNOSIS — K219 Gastro-esophageal reflux disease without esophagitis: Secondary | ICD-10-CM

## 2015-04-29 HISTORY — DX: Gastro-esophageal reflux disease without esophagitis: K21.9

## 2016-07-22 ENCOUNTER — Ambulatory Visit: Payer: Federal, State, Local not specified - PPO | Admitting: Podiatry

## 2016-07-29 ENCOUNTER — Ambulatory Visit (INDEPENDENT_AMBULATORY_CARE_PROVIDER_SITE_OTHER): Payer: Federal, State, Local not specified - PPO

## 2016-07-29 ENCOUNTER — Encounter: Payer: Self-pay | Admitting: Podiatry

## 2016-07-29 ENCOUNTER — Ambulatory Visit (INDEPENDENT_AMBULATORY_CARE_PROVIDER_SITE_OTHER): Payer: Federal, State, Local not specified - PPO | Admitting: Podiatry

## 2016-07-29 VITALS — Ht 69.0 in | Wt 180.0 lb

## 2016-07-29 DIAGNOSIS — M79671 Pain in right foot: Secondary | ICD-10-CM | POA: Diagnosis not present

## 2016-07-29 DIAGNOSIS — M79672 Pain in left foot: Secondary | ICD-10-CM

## 2016-07-29 DIAGNOSIS — M779 Enthesopathy, unspecified: Secondary | ICD-10-CM

## 2016-07-29 NOTE — Progress Notes (Signed)
   Subjective:    Patient ID: Gregory Kline, male    DOB: 05/02/1945, 71 y.o.   MRN: [id number]  HPI  Chief Complaint  Patient presents with  . Foot Orthotics    Wants to order today     Review of Systems  All other systems reviewed and are negative.      Objective:   Physical Exam        Assessment & Plan:

## 2016-07-30 ENCOUNTER — Ambulatory Visit: Payer: Federal, State, Local not specified - PPO | Admitting: Podiatry

## 2016-07-31 NOTE — Progress Notes (Signed)
Subjective:    Patient ID: Gregory Kline, male   DOB: 71 y.o.   MRN: [id number]   HPI patient presents with long-term history of wearing orthotics that have worn out that help his arches immensely    Review of Systems  All other systems reviewed and are negative.       Objective:  Physical Exam  Cardiovascular: Intact distal pulses.   Musculoskeletal: Normal range of motion.  Neurological: He is alert.  Skin: Skin is warm and dry.  Nursing note and vitals reviewed.  neurovascular status intact muscle strength was adequate with range of motion within normal limits. Patient's found to have a depressed arch bilateral with navicular bulging and discomfort of the posterior tibial as it inserts into the navicular bilateral. 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 I did consult with pedal or this concerning this and we are making him a type to lift the arch is and take pressure off posterior tibial tendon

## 2016-08-17 ENCOUNTER — Other Ambulatory Visit: Payer: Federal, State, Local not specified - PPO

## 2016-08-19 ENCOUNTER — Other Ambulatory Visit: Payer: Federal, State, Local not specified - PPO

## 2016-08-25 ENCOUNTER — Other Ambulatory Visit: Payer: Federal, State, Local not specified - PPO

## 2017-06-03 ENCOUNTER — Other Ambulatory Visit: Payer: Self-pay | Admitting: Family Medicine

## 2017-06-03 DIAGNOSIS — R05 Cough: Secondary | ICD-10-CM

## 2017-06-03 DIAGNOSIS — R0789 Other chest pain: Secondary | ICD-10-CM

## 2017-06-03 DIAGNOSIS — R053 Chronic cough: Secondary | ICD-10-CM

## 2017-06-06 ENCOUNTER — Ambulatory Visit
Admission: RE | Admit: 2017-06-06 | Discharge: 2017-06-06 | Disposition: A | Discharge status: Home / Self Care | Payer: Federal, State, Local not specified - PPO | Source: Ambulatory Visit | Attending: Family Medicine | Admitting: Family Medicine

## 2017-06-06 DIAGNOSIS — R05 Cough: Secondary | ICD-10-CM

## 2017-06-06 DIAGNOSIS — R053 Chronic cough: Secondary | ICD-10-CM

## 2017-06-06 DIAGNOSIS — R0789 Other chest pain: Secondary | ICD-10-CM

## 2017-10-20 ENCOUNTER — Emergency Department (HOSPITAL_COMMUNITY)
Admission: EM | Admit: 2017-10-20 | Discharge: 2017-10-20 | Disposition: A | Discharge status: Home / Self Care | Payer: Federal, State, Local not specified - PPO | Attending: Emergency Medicine | Admitting: Emergency Medicine

## 2017-10-20 ENCOUNTER — Emergency Department (HOSPITAL_COMMUNITY): Payer: Federal, State, Local not specified - PPO

## 2017-10-20 ENCOUNTER — Encounter (HOSPITAL_COMMUNITY): Payer: Self-pay | Admitting: Emergency Medicine

## 2017-10-20 ENCOUNTER — Other Ambulatory Visit: Payer: Self-pay

## 2017-10-20 DIAGNOSIS — Z87891 Personal history of nicotine dependence: Secondary | ICD-10-CM | POA: Insufficient documentation

## 2017-10-20 DIAGNOSIS — Z8673 Personal history of transient ischemic attack (TIA), and cerebral infarction without residual deficits: Secondary | ICD-10-CM | POA: Insufficient documentation

## 2017-10-20 DIAGNOSIS — N183 Chronic kidney disease, stage 3 (moderate): Secondary | ICD-10-CM | POA: Insufficient documentation

## 2017-10-20 DIAGNOSIS — I129 Hypertensive chronic kidney disease with stage 1 through stage 4 chronic kidney disease, or unspecified chronic kidney disease: Secondary | ICD-10-CM | POA: Insufficient documentation

## 2017-10-20 DIAGNOSIS — E039 Hypothyroidism, unspecified: Secondary | ICD-10-CM | POA: Insufficient documentation

## 2017-10-20 DIAGNOSIS — R5383 Other fatigue: Secondary | ICD-10-CM | POA: Insufficient documentation

## 2017-10-20 DIAGNOSIS — Z7982 Long term (current) use of aspirin: Secondary | ICD-10-CM | POA: Diagnosis not present

## 2017-10-20 DIAGNOSIS — R4182 Altered mental status, unspecified: Secondary | ICD-10-CM | POA: Diagnosis not present

## 2017-10-20 DIAGNOSIS — Z91040 Latex allergy status: Secondary | ICD-10-CM | POA: Insufficient documentation

## 2017-10-20 DIAGNOSIS — Z79899 Other long term (current) drug therapy: Secondary | ICD-10-CM | POA: Diagnosis not present

## 2017-10-20 DIAGNOSIS — R531 Weakness: Secondary | ICD-10-CM | POA: Insufficient documentation

## 2017-10-20 DIAGNOSIS — N2889 Other specified disorders of kidney and ureter: Secondary | ICD-10-CM

## 2017-10-20 DIAGNOSIS — R1033 Periumbilical pain: Secondary | ICD-10-CM | POA: Insufficient documentation

## 2017-10-20 DIAGNOSIS — R410 Disorientation, unspecified: Secondary | ICD-10-CM | POA: Insufficient documentation

## 2017-10-20 LAB — I-STAT CHEM 8, ED
BUN: 22 mg/dL (ref 8–23)
CALCIUM ION: 1.31 mmol/L (ref 1.15–1.40)
CHLORIDE: 104 mmol/L (ref 98–111)
Creatinine, Ser: 1.7 mg/dL — ABNORMAL HIGH (ref 0.61–1.24)
GLUCOSE: 108 mg/dL — AB (ref 70–99)
HCT: 42 % (ref 39.0–52.0)
HEMOGLOBIN: 14.3 g/dL (ref 13.0–17.0)
Potassium: 4.1 mmol/L (ref 3.5–5.1)
Sodium: 142 mmol/L (ref 135–145)
TCO2: 26 mmol/L (ref 22–32)

## 2017-10-20 LAB — ETHANOL

## 2017-10-20 LAB — I-STAT TROPONIN, ED: Troponin i, poc: 0.01 ng/mL (ref 0.00–0.08)

## 2017-10-20 LAB — CBC
HEMATOCRIT: 45.2 % (ref 39.0–52.0)
HEMOGLOBIN: 15.1 g/dL (ref 13.0–17.0)
MCH: 30.6 pg (ref 26.0–34.0)
MCHC: 33.4 g/dL (ref 30.0–36.0)
MCV: 91.7 fL (ref 78.0–100.0)
Platelets: 185 10*3/uL (ref 150–400)
RBC: 4.93 MIL/uL (ref 4.22–5.81)
RDW: 13.3 % (ref 11.5–15.5)
WBC: 7.3 10*3/uL (ref 4.0–10.5)

## 2017-10-20 LAB — COMPREHENSIVE METABOLIC PANEL
ALK PHOS: 52 U/L (ref 38–126)
ALT: 20 U/L (ref 0–44)
ANION GAP: 9 (ref 5–15)
AST: 19 U/L (ref 15–41)
Albumin: 3.9 g/dL (ref 3.5–5.0)
BILIRUBIN TOTAL: 0.8 mg/dL (ref 0.3–1.2)
BUN: 22 mg/dL (ref 8–23)
CALCIUM: 10 mg/dL (ref 8.9–10.3)
CO2: 27 mmol/L (ref 22–32)
CREATININE: 1.61 mg/dL — AB (ref 0.61–1.24)
Chloride: 108 mmol/L (ref 98–111)
GFR, EST AFRICAN AMERICAN: 47 mL/min — AB (ref >60)
GFR, EST NON AFRICAN AMERICAN: 41 mL/min — AB (ref >60)
Glucose, Bld: 113 mg/dL — ABNORMAL HIGH (ref 70–99)
Potassium: 4.1 mmol/L (ref 3.5–5.1)
Sodium: 144 mmol/L (ref 135–145)
TOTAL PROTEIN: 6.3 g/dL — AB (ref 6.5–8.1)

## 2017-10-20 LAB — DIFFERENTIAL
Basophils Absolute: 0 10*3/uL (ref 0.0–0.1)
Basophils Relative: 0 %
EOS ABS: 0.1 10*3/uL (ref 0.0–0.7)
EOS PCT: 2 %
LYMPHS PCT: 12 %
Lymphs Abs: 0.9 10*3/uL (ref 0.7–4.0)
MONO ABS: 0.6 10*3/uL (ref 0.1–1.0)
Monocytes Relative: 8 %
NEUTROS PCT: 78 %
Neutro Abs: 5.7 10*3/uL (ref 1.7–7.7)

## 2017-10-20 LAB — PROTIME-INR
INR: 0.98
PROTHROMBIN TIME: 12.9 s (ref 11.4–15.2)

## 2017-10-20 LAB — URINALYSIS, ROUTINE W REFLEX MICROSCOPIC
BACTERIA UA: NONE SEEN
Bilirubin Urine: NEGATIVE
Glucose, UA: NEGATIVE mg/dL
Hgb urine dipstick: NEGATIVE
KETONES UR: NEGATIVE mg/dL
Leukocytes, UA: NEGATIVE
NITRITE: NEGATIVE
PROTEIN: 100 mg/dL — AB
Specific Gravity, Urine: 1.015 (ref 1.005–1.030)
pH: 5 (ref 5.0–8.0)

## 2017-10-20 LAB — APTT: aPTT: 26 seconds (ref 24–36)

## 2017-10-20 LAB — CBG MONITORING, ED: GLUCOSE-CAPILLARY: 92 mg/dL (ref 70–99)

## 2017-10-20 NOTE — ED Notes (Signed)
Pts watch and ring given to wife for MRI. Pts belongings also given to wife.

## 2017-10-20 NOTE — Discharge Instructions (Signed)
You were given a referral to a neurologist.  The neurology office will call you to make an appointment for follow-up.    You should also make an appointment to follow-up with your neurosurgeon at the [facility] for reevaluation in regards to your shunt.  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  You will need to make an appointment with your primary doctor to follow-up and have an MRI of your kidney to further characterize this mass.  Please monitor your symptoms and if you have any new or worsening symptoms in the meantime please return to the emergency department.

## 2017-10-20 NOTE — ED Notes (Signed)
This RN called MRI to check status of pt in queue. Per MRI: they will get him within 15-20 mins

## 2017-10-20 NOTE — ED Notes (Signed)
Pt and family updated on plan of care  

## 2017-10-20 NOTE — ED Triage Notes (Signed)
Patient reports weakness, disorientation and slurred speech x 2 days. Pt's wife states symptoms have worsened in last 24 hrs. pts PCP was called but felt it better to come straight to ED.

## 2017-10-20 NOTE — ED Provider Notes (Signed)
[name] Provider Note   CSN: [id number] Arrival date & time: 10/20/17  1232     History   Chief Complaint Chief Complaint  Patient presents with  . Weakness    HPI Gregory Kline is a 72 y.o. male.  HPI   Patient is a 72 year old male with a history of CVA, hydrocephalus S/P shunt, renal cell carcinoma s/p left nephrectomy, hypertension, hyperlipidemia, who presents emergency department today his wife for evaluation of generalized weakness, increased fatigue, and altered mental status that his wife states has been present for the last 4-5 days.  Wife at bedside states that at baseline patient has speech delay, intermittently answers questions inappropriately, intermittently is disoriented, and frequently rolls his eyes in the back of his head.  She feels like all of these symptoms have worsened over the last several days. She thinks he seems more disoriented and has been rolling his eyes in the back of his head more frequently.  She denies any known fevers.  Patient is denying any chest pain, shortness of breath, cough.  Does report some abdominal pain to the right periumbilical area.  No recent falls or trauma. Pt reports weakness to his BUE and BLE. He denies unilateral numbness/weakness or vision changes. Denies headaches.   Past Medical History:  Diagnosis Date  . CVA (cerebral infarction)    on asa/plavix  . Depression   . Diverticulosis   . Horseshoe kidney   . Hydrocephalus    with shunt  . Hyperlipemia   . Hypertension   . Hypothyroid   . Renal cell carcinoma   . Restless leg syndrome     Patient Active Problem List   Diagnosis Date Noted  . Migraine headache 02/23/2011  . Hypophosphatemia 02/21/2011  . Leukocytosis 02/19/2011  . Hypotension 02/18/2011  . Lower GI bleed 02/17/2011  . Chronic kidney disease, stage III (moderate) ([place]) 02/17/2011  . Hypertension 02/17/2011  . Hypothyroidism 02/17/2011  . History of stroke  02/17/2011    Past Surgical History:  Procedure Laterality Date  . CERVICAL SPINE SURGERY     benign tumor  . COLONOSCOPY  02/21/2011   Procedure: COLONOSCOPY;  Surgeon: Lafayette Dragon, MD;  Location: [facility];  Service: Endoscopy;  Laterality: N/A;  . ESOPHAGOGASTRODUODENOSCOPY  02/19/2011   Procedure: ESOPHAGOGASTRODUODENOSCOPY (EGD);  Surgeon: Juanita Craver, MD;  Location: [facility];  Service: Endoscopy;  Laterality: N/A;  . FLEXIBLE SIGMOIDOSCOPY  02/19/2011   Procedure: FLEXIBLE SIGMOIDOSCOPY;  Surgeon: Juanita Craver, MD;  Location: [facility];  Service: Endoscopy;  Laterality: N/A;  . KIDNEY SURGERY    . PARATHYROIDECTOMY    . PARTIAL COLECTOMY    . POLYPECTOMY  02/21/2011   Procedure: POLYPECTOMY;  Surgeon: Lafayette Dragon, MD;  Location: [facility];  Service: Endoscopy;  Laterality: Left;  . THYROIDECTOMY, PARTIAL          Home Medications    Prior to Admission medications   Medication Sig Start Date End Date Taking? Authorizing Provider  acetaminophen (TYLENOL) 500 MG tablet Take 1,000 mg by mouth 2 (two) times daily.   Yes [provider]  aspirin 325 MG tablet Take by mouth. 06/05/15  Yes [provider]  atorvastatin (LIPITOR) 20 MG tablet Take 40 mg by mouth daily.    Yes [provider]  dextran 70-hypromellose (TEARS RENEWED) ophthalmic solution Place 1 drop into both eyes at bedtime.    Yes [provider]  DULoxetine (CYMBALTA) 60 MG capsule Take 60 mg by  mouth 2 (two) times daily.     Yes [provider]  finasteride (PROSCAR) 5 MG tablet Take 5 mg by mouth daily.    Yes [provider]  gabapentin (NEURONTIN) 100 MG capsule Take by mouth.   Yes [provider]  levothyroxine (SYNTHROID, LEVOTHROID) 100 MCG tablet Take 100 mcg by mouth daily before breakfast.    Yes [provider]  modafinil (PROVIGIL) 200 MG tablet Take by mouth.   Yes [provider]  Multiple Vitamin  (MULTI-VITAMINS) TABS Take by mouth.   Yes [provider]  MYRBETRIQ 50 MG TB24 tablet  08/17/17  Yes [provider]  pantoprazole (PROTONIX) 40 MG tablet Take 1 tablet (40 mg total) by mouth daily at 6 (six) AM. 02/25/11 10/20/17 Yes Viyuoh, Adeline C, MD  risedronate (ACTONEL) 35 MG tablet Take 35 mg by mouth 3 (three) times a week. with water on empty stomach, nothing by mouth or lie down for next 30 minutes.takes on sunday   Yes [provider]  Travoprost, BAK Free, (TRAVATAN) 0.004 % SOLN ophthalmic solution Place 1 drop into both eyes at bedtime.     Yes [provider]    Family History Family History  Problem Relation Age of Onset  . Cancer Mother   . Leukemia Father     Social History Social History   Tobacco Use  . Smoking status: Former Research scientist (life sciences)  . Smokeless tobacco: Never Used  Substance Use Topics  . Alcohol use: Yes    Alcohol/week: 0.6 oz    Types: 1 drink(s) per week    Comment: occassional  . Drug use: No    Comment: quit in teens (smoked approx. 6 months)     Allergies   Cafergot; Celebrex [celecoxib]; Lactose; Latex; and Sulfa antibiotics   Review of Systems Review of Systems  Unable to perform ROS: Mental status change  Constitutional: Negative for fever.  Eyes: Negative for visual disturbance.       Eyes rolling in back of head  Respiratory: Negative for shortness of breath.   Cardiovascular: Negative for chest pain.  Gastrointestinal: Positive for abdominal pain.  Musculoskeletal: Negative for back pain.  Skin: Negative for rash.  Neurological: Negative for headaches.   Physical Exam Updated Vital Signs BP (!) 161/104   Pulse 74   Temp 98 F (36.7 C) (Oral)   Resp 15   Ht 5\' 9"  (1.753 m)   Wt 83.9 kg (185 lb)   SpO2 99%   BMI 27.32 kg/m   Physical Exam  Constitutional: He appears well-developed and well-nourished.  HENT:  Head: Normocephalic and atraumatic.  Mouth/Throat: Oropharynx is clear and  moist.  Eyes: Pupils are equal, round, and reactive to light. Conjunctivae and EOM are normal.  Eyes frequently rolling in back of head while talking.  Neck: Neck supple.  Cardiovascular: Normal rate, regular rhythm, normal heart sounds and intact distal pulses.  No murmur heard. Pulmonary/Chest: Effort normal and breath sounds normal. No stridor. No respiratory distress. He has no wheezes.  Abdominal: Soft. Bowel sounds are normal.  Large midline abd scar the is well healed. Mild TTP to right periumbilical area. No rigidity.   Musculoskeletal: He exhibits no edema.  Neurological: He is alert.  Mental Status:  Alert, intermittently answers questions inappropriately, oriented to self, time, situation, and place. Speech is delayed and intermittently slurred. Able to follow 2 step commands. Cranial Nerves:  II: pupils equal, round, reactive to light III,IV, VI: ptosis not present, extra-ocular  motions intact bilaterally  V,VII: smile symmetric, facial light touch sensation equal VIII: hearing grossly normal to voice  X: uvula elevates symmetrically  XI: bilateral shoulder shrug symmetric and strong XII: midline tongue extension without fassiculations Motor:  Normal tone. 5/5 strength of BUE and BLE major muscle groups including strong and equal grip strength Sensory: light touch normal in all extremities. Cerebellar: normal finger-to-nose with bilateral upper extremities, normal heel to shin Gait: not assessed CV: 2+ radial and DP/PT pulses No pronator drift  Skin: Skin is warm and dry. Capillary refill takes less than 2 seconds.  Psychiatric: He has a normal mood and affect.  Nursing note and vitals reviewed.    ED Treatments / Results  Labs (all labs ordered are listed, but only abnormal results are displayed) Labs Reviewed  URINALYSIS, ROUTINE W REFLEX MICROSCOPIC - Abnormal; Notable for the following components:      Result Value   Protein, ur 100 (*)    All other components  within normal limits  COMPREHENSIVE METABOLIC PANEL - Abnormal; Notable for the following components:   Glucose, Bld 113 (*)    Creatinine, Ser 1.61 (*)    Total Protein 6.3 (*)    GFR calc non Af Amer 41 (*)    GFR calc Af Amer 47 (*)    All other components within normal limits  I-STAT CHEM 8, ED - Abnormal; Notable for the following components:   Creatinine, Ser 1.70 (*)    Glucose, Bld 108 (*)    All other components within normal limits  CBC  ETHANOL  PROTIME-INR  APTT  DIFFERENTIAL  CBG MONITORING, ED  I-STAT TROPONIN, ED    EKG EKG Interpretation  Date/Time:  Thursday October 20 2017 12:54:15 EDT Ventricular Rate:  87 PR Interval:    QRS Duration: 115 QT Interval:  412 QTC Calculation: 496 R Axis:   -58 Text Interpretation:  Sinus rhythm LAD, consider left anterior fascicular block Borderline T abnormalities, anterior leads Confirmed by Milton Ferguson [phone]) on 10/20/2017 4:17:47 PM   Radiology Ct Abdomen Pelvis Wo Contrast  Result Date: 10/20/2017 CLINICAL DATA:  Weakness, disoriented. Acute abdominal pain. History of diverticulosis, renal cell carcinoma, partial colectomy. EXAM: CT ABDOMEN AND PELVIS WITHOUT CONTRAST TECHNIQUE: Multidetector CT imaging of the abdomen and pelvis was performed following the standard protocol without IV contrast. COMPARISON:  None. FINDINGS: LOWER CHEST: Bibasilar pleural thickening, scarring and mild bronchiectasis. Included heart size is normal. Focal anterior pericardial effusion versus thickening. HEPATOBILIARY: Normal. PANCREAS: Normal. SPLEEN: Normal. ADRENALS/URINARY TRACT: Status post LEFT nephrectomy. 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 2 mm nonobstructing RIGHT nephrolithiasis. Multiple RIGHT renal cysts measuring to 6.2 cm. Subcentimeter dense exophytic renal masses (50 Hounsfield units). No nephrolithiasis, hydronephrosis; limited assessment for renal masses by nonenhanced CT. The unopacified ureters are normal in  course and caliber. Urinary bladder is partially distended and unremarkable. Normal adrenal glands. STOMACH/BOWEL: The stomach, small and large bowel are normal in course and caliber without inflammatory changes, sensitivity decreased by lack of enteric contrast. Mild colonic diverticulosis. Normal appendix. VASCULAR/LYMPHATIC: Prominent splenic vein/varices. Aortoiliac vessels are normal in course and caliber. Moderate calcific atherosclerosis. No lymphadenopathy by CT size criteria. REPRODUCTIVE: Normal. OTHER: No intraperitoneal free fluid or free air. MUSCULOSKELETAL: Non-acute. Moderate bilateral fat containing inguinal hernias. Anterior abdominal wall scarring. Grade 1 L4-5 anterolisthesis. Chronic bilateral L5 pars interarticularis defects. L4 hemangioma. IMPRESSION: 1. Colonic diverticulosis without acute diverticulitis nor acute intra-abdominal/pelvic process. 2. Status post LEFT nephrectomy. 3. **An incidental finding of potential  clinical significance has been found. Complex RIGHT renal masses. Further evaluation with pre and post contrast MRI (if there is no contraindication) should be considered. Pre and post contrast CT could alternatively be performed, but would likely be of decreased accuracy given lesion size. ** 4. Mild bibasilar bronchiectasis. Aortic Atherosclerosis (ICD10-I70.0). Electronically Signed   By: Elon Alas M.D.   On: 10/20/2017 14:03   Dg Chest 2 View  Result Date: 10/20/2017 CLINICAL DATA:  Weakness, slurred speech. EXAM: CHEST - 2 VIEW COMPARISON:  Radiographs of May 19, 2017. FINDINGS: Stable cardiomegaly. No pneumothorax or pleural effusion is noted. Both lungs are clear. The visualized skeletal structures are unremarkable. IMPRESSION: No active cardiopulmonary disease. Electronically Signed   By: Marijo Conception, M.D.   On: 10/20/2017 17:31   Ct Head Wo Contrast  Result Date: 10/20/2017 CLINICAL DATA:  Patient reports weakness, disorientation and slurred  speech x 2 days. Pt's wife states symptoms have worsened in last 24 hrs. pts PCP was called but felt it better to come straight to ED. hx hydrocephalus with shunt EXAM: CT HEAD WITHOUT CONTRAST TECHNIQUE: Contiguous axial images were obtained from the base of the skull through the vertex without intravenous contrast. COMPARISON:  None. FINDINGS: Brain: Ventricles are relatively decompressed but are symmetric. Right frontal shunt catheter has its tip projecting in anterior right lateral ventricle near the foramen of Monro. No parenchymal masses or mass effect. There is no evidence of an infarct. There are no extra-axial masses or abnormal fluid collections. There is no intracranial hemorrhage. Vascular: There is prominence of the basilar artery. No evidence of vascular thrombosis. Skull: No skull fracture or lesion. Sinuses/Orbits: Mild right maxillary sinus mucosal thickening with dependent fluid. Remaining visualized sinuses are clear. Globes and orbits are unremarkable. Other: None. IMPRESSION: 1. Ventricles are relatively small for this patient's age, which raises the possibility of over correction of hydrocephalus. 2. No evidence of an infarct or of intracranial hemorrhage. No masses. 3. Right maxillary sinus mucosal thickening with dependent fluid. Consider acute sinusitis if there are consistent clinical findings. Electronically Signed   By: Lajean Manes M.D.   On: 10/20/2017 13:55   Mr Brain Wo Contrast  Result Date: 10/20/2017 CLINICAL DATA:  Level of consciousness, unexplained. Disorientation and slurred speech for 2 days. Progression over the last 24 hours. EXAM: MRI HEAD WITHOUT CONTRAST TECHNIQUE: Multiplanar, multiecho pulse sequences of the brain and surrounding structures were obtained without intravenous contrast. COMPARISON:  CT head without contrast 10/20/2017. FINDINGS: Brain: Diffusion-weighted images demonstrate no acute or subacute infarction. A right frontal ventriculostomy catheter is in  place. The ventricles are small relative to the degree of atrophy. This may reflect over shunting. Fourth ventricle is of normal size. A remote left inferior cerebellar infarct is present. Dilated perivascular spaces are present. Brainstem is normal. No acute hemorrhage or mass lesion is present. No significant extra-axial fluid collection present. Internal auditory canals are within normal limits bilaterally. Vascular: Flow is present in the major intracranial arteries. There is dolichoectasia of the vertebrobasilar system without significant mass effect on the brainstem. Skull and upper cervical spine: Skull base is within normal limits. Craniocervical junction is normal. The upper cervical spine is normal. Marrow signal is within normal limits. Sinuses/Orbits: Circumferential mucosal thickening is present in the right maxillary sinus. There is mild mucosal thickening in the anterior right ethmoid air cells. The remaining paranasal sinuses and mastoid air cells are clear. Globes and orbits are within normal limits. IMPRESSION: 1. No acute intracranial  abnormality. 2. Remote inferior left cerebellar infarct. 3. Advanced atrophy. 4. The lateral ventricles are small, suggesting the possibility of over shunting. 5. Dolichoectasia of the vertebrobasilar system. 6. Right anterior paranasal sinus disease. Electronically Signed   By: San Morelle M.D.   On: 10/20/2017 17:33    Procedures Procedures (including critical care time)  Medications Ordered in ED Medications - No data to display   Initial Impression / Assessment and Plan / ED Course  I have reviewed the triage vital signs and the nursing notes.  Pertinent labs & imaging results that were available during my care of the patient were reviewed by me and considered in my medical decision making (see chart for details).    Discussed pt presentation and exam findings with Dr. Roderic Palau, who agrees with the current workup and plan for MRI.   6:28  PM CONSULT with Dr. [name] with neurosurgery who reviewed the imaging studies and states that this is likely a chronic finding and the shunt has likely been like this for many years.  He states that patient should follow-up with his neurosurgeon as an outpatient in regards to this finding.  Final Clinical Impressions(s) / ED Diagnoses   Final diagnoses:  Altered mental status, unspecified altered mental status type  Kidney mass   Patient presenting with reported altered mental status per patient's wife that has been ongoing for the last 4 to 5 days.  Afebrile.  Normal vital signs here.  Neuro exam has no focal deficits but patient does have chronic delayed/slurred speech which wife thinks is worse today.  Is also rolling his eyes into the back of his head more frequently than normal.  Intermittently responds inappropriately which is also reportedly somewhat chronic for the patient to his wife.  CBC coags and troponin are normal.  CMP shows an elevated creatinine 1.61, last chemistry in epic was in 2017 and was 1.52.  CBG is normal and UA was without evidence of a UTI. Proteinuria present.  ECG with NSR. Unchanged from prior.  CXR negative for pneumonia.   Ct head shows small ventricles with possible overcorrection of hydrocephalus, no infart or hemorrhage.  Ct abd/pelvis without acute findings. Does show complex right renal masses. Communicated this with pt and wife.  MR brain without contrast shows a remote left cerebellar and also suggests over shunting.  There is also dolichoectasia in the vertebrobasilar system.  No evidence of new CVA on MRI.   Case was discussed with neurosurgery with recommendations as above.    On re-eval he is answering questions appropriately and is in no acute distress. He is requesting to be discharged. Discussed results with patient and wife at bedside.  Advised him to follow-up with neurosurgery.  Patient does not have a neurologist, will give ambulatory referral for  follow-up as well.  Advised patient to follow-up with his PCP in regards to his right renal mass.  Advised to monitor symptoms and if he has any new or worsening symptoms he needs to return to the ER immediately.   Pt appears reasonably screened. he has no signs of infection to cause his symptoms and has no new stroke or brain abnormality on his imaging studies.  pt appears stable for discharge.  All questions answered and patient understand the plan.    ED Discharge Orders        Ordered    Ambulatory referral to Neurology    Comments:  An appointment is requested in approximately: 2 weeks   10/20/17 1903  Bishop Dublin 10/20/17 1911    Milton Ferguson, MD 10/21/17 [phone]

## 2017-10-20 NOTE — ED Notes (Signed)
Pt made aware urine needed. Pt provided with urinal. Pt verbalizes understanding

## 2017-10-20 NOTE — ED Notes (Signed)
Small BM bedside commode.

## 2017-10-20 NOTE — ED Notes (Signed)
PA at bedside.

## 2017-10-24 ENCOUNTER — Encounter: Payer: Self-pay | Admitting: Neurology

## 2017-10-25 ENCOUNTER — Encounter: Payer: Self-pay | Admitting: Neurology

## 2017-10-25 ENCOUNTER — Ambulatory Visit: Payer: Federal, State, Local not specified - PPO | Admitting: Neurology

## 2017-10-25 ENCOUNTER — Other Ambulatory Visit: Payer: Self-pay

## 2017-10-25 ENCOUNTER — Encounter

## 2017-10-25 VITALS — BP 144/80 | HR 92 | Ht 69.5 in | Wt 185.0 lb

## 2017-10-25 DIAGNOSIS — R51 Headache: Secondary | ICD-10-CM | POA: Diagnosis not present

## 2017-10-25 DIAGNOSIS — Z982 Presence of cerebrospinal fluid drainage device: Secondary | ICD-10-CM | POA: Diagnosis not present

## 2017-10-25 DIAGNOSIS — R410 Disorientation, unspecified: Secondary | ICD-10-CM

## 2017-10-25 DIAGNOSIS — R519 Headache, unspecified: Secondary | ICD-10-CM

## 2017-10-25 MED ORDER — GABAPENTIN 100 MG PO CAPS: 100.0000 mg | ORAL_CAPSULE | Freq: Two times a day (BID) | ORAL | 11 refills | Status: DC

## 2017-10-25 NOTE — Patient Instructions (Addendum)
1. Bloodwork for ESR, CRP 2. Schedule 1-hour EEG  Your provider has requested that you have LABS drawn in the FUTURE.  Our laboratory is located within [facility], [phone] of this building.  You do not need an appointment however, please come to [facility] prior to going to the lab - we will check you into the lab.     3. Increase Gabapentin 181m: Take 1 capsule twice a day 4. Refer to Neurosurgery for evaluation of shunt  [facility]  They will contact you directly to schedule your appointment.  If you do not hear from them, their office can be reached at [phone]  5. Follow-up in 4-5 months, call for any changes

## 2017-10-25 NOTE — Progress Notes (Signed)
NEUROLOGY CONSULTATION NOTE  Gregory Kline MRN: [id number] DOB: May 06, 1945  Referring provider: Dr. Milton Ferguson (ER) Primary care provider: Dr. Lennette Bihari [facility])  Reason for consult:  Altered mental status  Dear Dr Roderic Palau:  Thank you for your kind referral of Gregory Kline for consultation of the above symptoms. Although his history is well known to you, please allow me to reiterate it for the purpose of our medical record. The patient was accompanied to the clinic by his wife who also provides collateral information. Records and images were personally reviewed where available.  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 His wife reports that over the past few months, he has had more trouble concentrating and difficulty staying on track. They deny any nonsensical speech. He dozes off to sleep frequently. He has also been having more headaches, she reports that he has had headaches for several years, usually getting better by spring time, but recently he has gone through a surge of clusters of headaches lasting 2-3 days at a time. Longest headache-free interval is 2-3 days. Headaches are usually worse in the Fall, but she states things have changed. He describes different kinds of pain on either temporal region and around his eyes lasting several hours. Acetaminophen+caffeine do not help. There is no associated nausea/vomiting. Sometimes he is sensitive to lights and sounds. He states it has affected his mood as well, "I don't like me with it," depression is worse, he gets irritated more easily. At baseline, he has repetitive upward eye rolling movements, but she states that these have also increased in frequency since December 2018. Due to  worsening symptoms, he went to the ER on 10/20/2017 where he had a head CT and MRI brain without contrast which I personally reviewed, no acute changes seen, there was chronic left inferior cerebellar infarct, right ventriculostomy catheter in place, ventricles are small relative to the degree of atrophy, suggesting the possibility of overshunting. Images were reviewed by neurosurgeon Dr. [name] who stated that this is likely a chronic finding and shunt has likely been like this for many years, recommending outpatient neurosurgery follow-up. There is note of a prior MRI brain done 05/2015, images unavailable for review, with right frontal approach ventricular shunt with ventricles appearing relatively decompressed relative to degree of cerebral volume loss.  His wife denies any staring episodes. Sometimes he does not respond but she feels this is due to his hearing loss. They recall being told in 2017 that there was a slight blockage in his VP shunt, but now were told there was "more than one place blocked." He denies any dizziness, diplopia, dysarthria/dysphagia, neck pain, focal numbness/tingling/weakness, bowel dysfunction. He has low back pain and urinary frequency. He recalls a fall this year during his exercise class.   PAST MEDICAL HISTORY: Past Medical History:  Diagnosis Date  . CVA (cerebral infarction)    on asa/plavix  . Depression   . Diverticulosis   . Horseshoe kidney   . Hydrocephalus    with shunt  . Hyperlipemia   . Hypertension   . Hypothyroid   . Renal cell carcinoma   . Restless leg syndrome     PAST SURGICAL HISTORY: Past Surgical History:  Procedure Laterality Date  . CERVICAL SPINE SURGERY  benign tumor  . COLONOSCOPY  02/21/2011   Procedure: COLONOSCOPY;  Surgeon: Lafayette Dragon, MD;  Location: [facility];  Service: Endoscopy;  Laterality: N/A;  . ESOPHAGOGASTRODUODENOSCOPY  02/19/2011   Procedure: ESOPHAGOGASTRODUODENOSCOPY (EGD);  Surgeon: Juanita Craver, MD;   Location: [facility];  Service: Endoscopy;  Laterality: N/A;  . FLEXIBLE SIGMOIDOSCOPY  02/19/2011   Procedure: FLEXIBLE SIGMOIDOSCOPY;  Surgeon: Juanita Craver, MD;  Location: [facility];  Service: Endoscopy;  Laterality: N/A;  . KIDNEY SURGERY    . PARATHYROIDECTOMY    . PARTIAL COLECTOMY    . POLYPECTOMY  02/21/2011   Procedure: POLYPECTOMY;  Surgeon: Lafayette Dragon, MD;  Location: [facility];  Service: Endoscopy;  Laterality: Left;  . THYROIDECTOMY, PARTIAL      MEDICATIONS: Current Outpatient Medications on File Prior to Visit  Medication Sig Dispense Refill  . acetaminophen (TYLENOL) 500 MG tablet Take 1,000 mg by mouth 2 (two) times daily.    Marland Kitchen aspirin 325 MG tablet Take by mouth.    Marland Kitchen atorvastatin (LIPITOR) 20 MG tablet Take 40 mg by mouth daily.     Marland Kitchen dextran 70-hypromellose (TEARS RENEWED) ophthalmic solution Place 1 drop into both eyes at bedtime.     . DULoxetine (CYMBALTA) 60 MG capsule Take 60 mg by mouth 2 (two) times daily.      . finasteride (PROSCAR) 5 MG tablet Take 5 mg by mouth daily.     Marland Kitchen gabapentin (NEURONTIN) 100 MG capsule Take by mouth.    . levothyroxine (SYNTHROID, LEVOTHROID) 100 MCG tablet Take 100 mcg by mouth daily before breakfast.     . modafinil (PROVIGIL) 200 MG tablet Take by mouth.    . Multiple Vitamin (MULTI-VITAMINS) TABS Take by mouth.    Marland Kitchen MYRBETRIQ 50 MG TB24 tablet     . pantoprazole (PROTONIX) 40 MG tablet Take 1 tablet (40 mg total) by mouth daily at 6 (six) AM. 30 tablet 0  . risedronate (ACTONEL) 35 MG tablet Take 35 mg by mouth 3 (three) times a week. with water on empty stomach, nothing by mouth or lie down for next 30 minutes.takes on sunday    . Travoprost, BAK Free, (TRAVATAN) 0.004 % SOLN ophthalmic solution Place 1 drop into both eyes at bedtime.       No current facility-administered medications on file prior to visit.     ALLERGIES: Allergies  Allergen Reactions  . Cafergot Rash  . Celebrex [Celecoxib] Rash  . Lactose  Rash  . Latex Rash    Sensitivity per pt  . Sulfa Antibiotics Rash    FAMILY HISTORY: Family History  Problem Relation Age of Onset  . Cancer Mother   . Leukemia Father     SOCIAL HISTORY: Social History   Socioeconomic History  . Marital status: Married    Spouse name: Not on file  . Number of children: Not on file  . Years of education: Not on file  . Highest education level: Not on file  Occupational History  . Not on file  Social Needs  . Financial resource strain: Not on file  . Food insecurity:    Worry: Not on file    Inability: Not on file  . Transportation needs:    Medical: Not on file    Non-medical: Not on file  Tobacco Use  . Smoking status: Former Research scientist (life sciences)  . Smokeless tobacco: Never Used  Substance and Sexual Activity  . Alcohol use: Yes    Alcohol/week: 0.6 oz    Types:  1 drink(s) per week    Comment: occassional  . Drug use: No    Comment: quit in teens (smoked approx. 6 months)  . Sexual activity: Not on file  Lifestyle  . Physical activity:    Days per week: Not on file    Minutes per session: Not on file  . Stress: Not on file  Relationships  . Social connections:    Talks on phone: Not on file    Gets together: Not on file    Attends religious service: Not on file    Active member of club or organization: Not on file    Attends meetings of clubs or organizations: Not on file    Relationship status: Not on file  . Intimate partner violence:    Fear of current or ex partner: Not on file    Emotionally abused: Not on file    Physically abused: Not on file    Forced sexual activity: Not on file  Other Topics Concern  . Not on file  Social History Narrative  . Not on file    REVIEW OF SYSTEMS: Constitutional: No fevers, chills, or sweats, no generalized fatigue, change in appetite Eyes: No visual changes, double vision, eye pain Ear, nose and throat: No hearing loss, ear pain, nasal congestion, sore throat Cardiovascular: No chest  pain, palpitations Respiratory:  No shortness of breath at rest or with exertion, wheezes GastrointestinaI: No nausea, vomiting, diarrhea, abdominal pain, fecal incontinence Genitourinary:  No dysuria, urinary retention or frequency Musculoskeletal:  No neck pain,+ back pain Integumentary: No rash, pruritus, skin lesions Neurological: as above Psychiatric: No depression, insomnia, anxiety Endocrine: No palpitations, fatigue, diaphoresis, mood swings, change in appetite, change in weight, increased thirst Hematologic/Lymphatic:  No anemia, purpura, petechiae. Allergic/Immunologic: no itchy/runny eyes, nasal congestion, recent allergic reactions, rashes  PHYSICAL EXAM: Vitals:   10/25/17 1323  BP: (!) 144/80  Pulse: 92  SpO2: 95%   General: No acute distress. There were multiple episodes of upward eye rolling with no loss of awareness, he was able to speak and follow commands. He seemed to have less episodes of upward eye rolling when concentrating on a task Head:  Normocephalic/atraumatic, no temporal artery tenderness or ropiness Eyes: Fundoscopic exam shows bilateral sharp discs, no vessel changes, exudates, or hemorrhages Neck: supple, no paraspinal tenderness, full range of motion Back: No paraspinal tenderness Heart: regular rate and rhythm Lungs: Clear to auscultation bilaterally. Vascular: No carotid bruits. Skin/Extremities: No rash, no edema Neurological Exam: Mental status: alert and oriented to person, place, and time, +mild dysarthria with scanning speech, no aphasia, Fund of knowledge is appropriate.  Recent and remote memory are intact.  Attention and concentration are normal.    Able to name objects and repeat phrases. Cranial nerves: CN I: not tested CN II: pupils equal, round and reactive to light, visual fields intact, fundi unremarkable. CN III, IV, VI:  full range of motion, no nystagmus, no ptosis CN V: facial sensation intact CN VII: upper and lower face  symmetric CN VIII: hearing intact to finger rub CN IX, X: gag intact, uvula midline CN XI: sternocleidomastoid and trapezius muscles intact CN XII: tongue midline Bulk & Tone: normal, no fasciculations. Motor: 5/5 throughout with no pronator drift. Sensation: intact to light touch, cold, pin. Decreased vibration to left knee.  No extinction to double simultaneous stimulation.  Romberg test negative Deep Tendon Reflexes: +2 throughout, no ankle clonus Plantar responses: downgoing bilaterally Cerebellar: no incoordination on finger to nose, heel  to shin. No dysdiadochokinesia Gait: slightly wide-based, no ataxia, mild difficulty with tandem walk Tremor: none  IMPRESSION: This is a pleasant 72 year old right-handed man with a history of left cerebellar stroke at age 29, hydrocephalus s/p VP shunt, renal cell carcinoma s/p left nephrectomy, hypertension, hyperlipidemia, presenting after hospital visit for altered mental status. It appears that at baseline he has mild dysarthria and recurrent upward eye rolling, but recently he has had more episodes of upward eye rolling with no alteration of awareness, as well as more difficulty concentrating and increasing headaches. MRI brain in the ER showed a chronic left inferior cerebellar infarct, right ventriculostomy catheter in place, ventricles are small relative to the degree of atrophy, suggesting the possibility of overshunting. Images were reviewed by neurosurgeon Dr. [name] who stated that this is likely a chronic finding and shunt has likely been like this for many years, recommending outpatient neurosurgery follow-up, referral will be sent today. We discussed other potential causes of increase in headaches and confusion, check ESR, CRP, we will also do a 1-hour EEG. We discussed symptomatic treatment of headaches with increasing gabapentin to 178m BID, side effects discussed, we may uptitrate further if needed. He reports worsening depression and will  discuss this with his PCP, Dr. VLynelle Doctor He will follow-up in 4-5 months and knows to call for any changes.   Thank you for allowing me to participate in the care of this patient. Please do not hesitate to call for any questions or concerns.   KEllouise Newer M.D.  CC: Dr. VLynelle Doctor

## 2017-10-27 ENCOUNTER — Telehealth: Payer: Self-pay

## 2017-10-27 ENCOUNTER — Other Ambulatory Visit: Payer: Self-pay

## 2017-10-27 MED ORDER — GABAPENTIN 100 MG PO CAPS: 100.0000 mg | ORAL_CAPSULE | Freq: Two times a day (BID) | ORAL | 11 refills | Status: DC

## 2017-10-27 NOTE — Telephone Encounter (Signed)
Received call from pt stating that he is "done being depressed" pt continued to speak in circles - asking how he is to increase meds, that he has reached out to his church and family, all avenues are exhausted, he is just done with everything.  I gave pt number to behavior health crisis line, but did not feel that he would utilize it.  I had Su Hoff, LNP call [phone] and have units dispatched to pt's home.  Stayed on the line with pt's wife until units arrived.  During that time wife stated that she did not feel that her life was in danger, nor that he would take his own life.  Wife stated that pt's depression comes and goes in waves and has a cycle, however this was the "wrose that she has seen".  Once units arrived I spoke with a police officer relaying what wife and I spoke about, also relayed pt's medication list and that pt was recently seen in ED for altered mental status.

## 2017-10-28 ENCOUNTER — Telehealth: Payer: Self-pay | Admitting: Neurology

## 2017-10-28 NOTE — Telephone Encounter (Signed)
Pt returned your call and would like a call back .. °

## 2017-10-28 NOTE — Telephone Encounter (Signed)
Patient wants know if we called in a [facility] to [facility] [address]. He is trying to compare the prices from the [place] [facility]. He does not know the name of the medication. Please call and let him know

## 2017-10-31 ENCOUNTER — Ambulatory Visit (INDEPENDENT_AMBULATORY_CARE_PROVIDER_SITE_OTHER): Payer: Federal, State, Local not specified - PPO | Admitting: Neurology

## 2017-10-31 ENCOUNTER — Telehealth: Payer: Self-pay

## 2017-10-31 DIAGNOSIS — Z982 Presence of cerebrospinal fluid drainage device: Secondary | ICD-10-CM

## 2017-10-31 DIAGNOSIS — R410 Disorientation, unspecified: Secondary | ICD-10-CM | POA: Diagnosis not present

## 2017-10-31 DIAGNOSIS — R51 Headache: Secondary | ICD-10-CM

## 2017-10-31 DIAGNOSIS — R519 Headache, unspecified: Secondary | ICD-10-CM

## 2017-10-31 NOTE — Telephone Encounter (Signed)
Received notice from [facility] advising of pt's appointment on August 23 @ 2pm with Dr. [name]

## 2017-10-31 NOTE — Telephone Encounter (Signed)
Spoke with pt before leaving the office on 10/28/17.  Pt was wanting to [facility] [place].  Explained that per pt's request, I had sent Rx to the [place].  Looked on GoodRx.com and went over "usual cost per pharmacy" with pt.  [facility]entin [facility] without [facility] would run roughly $80.  Pt happy Rx was sent to [place] as his refills are only $7.

## 2017-11-04 NOTE — Progress Notes (Signed)
Mr. Schue asked for his BP to be manually be checked after having "high readings."  Has no complaints, denies pain or other negative symptoms.  I let him know I'd be happy to check his BP manually when I have office hours.  Also advised him to consult with his PMD if he continues to get high readings or with any associated symptoms.

## 2017-11-13 NOTE — Procedures (Signed)
ELECTROENCEPHALOGRAM REPORT  Date of Study: 10/31/2017  Patient's Name: Gregory Kline MRN: [id number] Date of Birth: 21-Oct-1945  Referring Provider: Dr. Ellouise Newer  Clinical History: This is a 72 year old man with history of stroke, VP shunt, with increasing confusion  Medications: NEURONTIN 100 MG capsule TYLENOL 500 MG tablet  aspirin 325 MG tablet  LIPITOR 20 MG tablet  TEARS RENEWED ophthalmic solution   CYMBALTA 60 MG capsule  PROSCAR 5 MG tablet  SYNTHROID, LEVOTHROID 100 MCG tablet   PROVIGIL 200 MG tablet  MULTI-VITAMINS TABS  MYRBETRIQ 50 MG TB24 tablet   PROTONIX 40 MG tablet  ACTONEL 35 MG tablet   Technical Summary: A multichannel digital 1-hour EEG recording measured by the international 10-20 system with electrodes applied with paste and impedances below 5000 ohms performed in our laboratory with EKG monitoring in an awake and asleep patient.  Hyperventilation was not performed. Photic stimulation was performed.  The digital EEG was referentially recorded, reformatted, and digitally filtered in a variety of bipolar and referential montages for optimal display.    Description: The patient is awake and asleep during the recording.  During maximal wakefulness, there is a symmetric, medium voltage 9-9.5 Hz posterior dominant rhythm that attenuates with eye opening.  There is breach artifact with higher voltage activity seen over the right frontal region. During drowsiness and stage I sleep, there is an increase in theta slowing of the background with vertex waves seen.  Photic stimulation did not elicit any abnormalities.  There were no epileptiform discharges or electrographic seizures seen.    EKG lead was unremarkable.  Impression: This 1-hour awake and asleep EEG is mildly abnormal due to breach artifact over the right frontal region.  Clinical Correlation of the above findings is consistent with prior surgery in this region. The absence of epileptiform  discharges does not exclude a clinical diagnosis of epilepsy.  If further clinical questions remain, prolonged EEG may be helpful.  Clinical correlation is advised.   Ellouise Newer, M.D.

## 2017-12-22 ENCOUNTER — Ambulatory Visit: Payer: Federal, State, Local not specified - PPO | Admitting: Neurology

## 2018-02-16 ENCOUNTER — Ambulatory Visit (INDEPENDENT_AMBULATORY_CARE_PROVIDER_SITE_OTHER): Payer: Federal, State, Local not specified - PPO | Admitting: Neurology

## 2018-02-16 ENCOUNTER — Telehealth: Payer: Self-pay | Admitting: Neurology

## 2018-02-16 ENCOUNTER — Encounter: Payer: Self-pay | Admitting: Neurology

## 2018-02-16 ENCOUNTER — Other Ambulatory Visit: Payer: Self-pay

## 2018-02-16 VITALS — BP 134/90 | HR 76 | Ht 69.0 in | Wt 186.0 lb

## 2018-02-16 DIAGNOSIS — R413 Other amnesia: Secondary | ICD-10-CM | POA: Diagnosis not present

## 2018-02-16 DIAGNOSIS — R51 Headache: Secondary | ICD-10-CM | POA: Diagnosis not present

## 2018-02-16 DIAGNOSIS — R519 Headache, unspecified: Secondary | ICD-10-CM

## 2018-02-16 DIAGNOSIS — F329 Major depressive disorder, single episode, unspecified: Secondary | ICD-10-CM | POA: Diagnosis not present

## 2018-02-16 DIAGNOSIS — F32A Depression, unspecified: Secondary | ICD-10-CM

## 2018-02-16 MED ORDER — GABAPENTIN 100 MG PO CAPS
ORAL_CAPSULE | ORAL | 5 refills | Status: DC
Start: 2018-02-16 — End: 2018-02-16

## 2018-02-16 MED ORDER — GABAPENTIN 100 MG PO CAPS: ORAL_CAPSULE | ORAL | 11 refills | Status: DC

## 2018-02-16 MED ORDER — GABAPENTIN 100 MG PO CAPS: ORAL_CAPSULE | ORAL | 3 refills | Status: DC

## 2018-02-16 NOTE — Telephone Encounter (Signed)
Patient is calling with questions about medications. Patient is confused and I couldn't understand it. Please call him back at [phone]. Thanks!

## 2018-02-16 NOTE — Progress Notes (Signed)
NEUROLOGY FOLLOW UP OFFICE NOTE  LEVONE OTTEN [id number] 09-12-1945  HISTORY OF PRESENT ILLNESS: I had the pleasure of seeing Poseidon Pam in follow-up in the neurology clinic on 02/16/2018.  The patient was last seen 4 months ago for altered mental status. He is alone in the office today. Records and images were personally reviewed where available.  He presented in July 2019 after an ER visit for increased difficulty concentrating and increasing headaches. MRI brain in the ER showed a chronic left inferior cerebellar infarct, right ventriculostomy catheter in place, ventricles are small relative to the degree of atrophy, suggesting the possibility of overshunting. He was evaluated by Neurosurgery at [facility] and it was felt that his shunt is over 30 years old and may or may not be functional, however if he had shunt failure, it would be ventriculomegaly and headaches would be positional. He had an EEG which was mildly abnormal due to breach artifact over the right frontal region, no epileptiform discharges or slowing seen. On his last visit, gabapentin was increased to '100mg'$  BID for headache prophylaxis. He is a poor historian and was upset that he was late for today's visit, hyperfocusing on his time. He was seen for 30 minutes in the office today. 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 His wife also manages his medications. He denies getting lost driving with his GPS. He got lost in our building trying to find our 3rd floor office, going to the 2nd and 4th floor. He reports the headaches are "to some extent frequent," hard to define, sometimes in clusters. This week he has had a couple of headaches lasting a few hours. He recently got a prescription from the [place] for Axert which was previously helping headaches, but he has not  taken it yet. He states "I've got a lousy attitude to the point where I don't care, I'm not suicidal." He states that if he is not dealing with mood, he is generally gregarious. 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 He sees a psychiatrist at the [place] and will be seeing a therapist on the 6th. He states he is "on the bottom rung at the [place]," but has difficulty defining this.   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 His wife reports that over the past few months, he has had more trouble concentrating and difficulty staying on track. They deny any nonsensical speech. He dozes off to sleep frequently. He has also been having more headaches, she reports that he has had headaches for several years, usually getting better by spring time, but recently he has gone through a surge of clusters of headaches lasting 2-3 days at a time. Longest headache-free interval is 2-3 days. Headaches are usually worse in the Fall, but she states things have changed. He describes different kinds of pain on either temporal region and around his eyes lasting several hours. Acetaminophen+caffeine do not help. There is no associated nausea/vomiting. Sometimes he is sensitive to lightsThis is a pleasant 72 year old right-handed man with a history of left cerebellar  stroke at age 63, hydrocephalus s/p VP shunt, renal cell carcinoma s/p left nephrectomy, hypertension, hyperlipidemia, presenting after hospital visit for altered mental status. It appears that at baseline he has mild dysarthria and recurrent upward eye rolling, but recently he has had more episodes of upward eye rolling with no alteration of awareness, as well as more difficulty  concentrating and increasing headaches. MRI brain in the ER showed a chronic left inferior cerebellar infarct, right ventriculostomy catheter in place, ventricles are small relative to the degree of atrophy, suggesting the possibility of overshunting. Images were reviewed by neurosurgeon Dr. [name] who stated that this is likely a chronic finding and shunt has likely been like this for many years, recommending outpatient neurosurgery follow-up, referral will be sent today. We discussed other potential causes of increase in headaches and confusion, check ESR, CRP, we will also do a 1-hour EEG. We discussed symptomatic treatment of headaches with increasing gabapentin to '100mg'$  BID, side effects discussed, we may uptitrate further if needed. He reports worsening depression and will discuss this with his PCP, Dr. Lynelle Doctor. He will follow-up in 4-5 months and knows to call for any changes. and sounds. He states it has affected his mood as well, "I don't like me with it," depression is worse, he gets irritated more easily. At baseline, he has repetitive upward eye rolling movements, but she states that these have also increased in frequency since December 2018. Due to worsening symptoms, he went to the ER on 10/20/2017 where he had a head CT and MRI brain without contrast which I personally reviewed, no acute changes seen, there was chronic left inferior cerebellar infarct, right ventriculostomy catheter in place, ventricles are small relative to the degree of atrophy, suggesting the possibility of overshunting. Images were reviewed by neurosurgeon Dr. [name] who stated that this is likely a chronic finding and shunt has likely been like this for many years, recommending outpatient neurosurgery follow-up. There is note of a prior MRI brain done 05/2015, images unavailable for review, with right frontal approach ventricular shunt with ventricles appearing relatively decompressed relative to degree of cerebral volume  loss.  His wife denies any staring episodes. Sometimes he does not respond but she feels this is due to his hearing loss. They recall being told in 2017 that there was a slight blockage in his VP shunt, but now were told there was "more than one place blocked." He denies any dizziness, diplopia, dysarthria/dysphagia, neck pain, focal numbness/tingling/weakness, bowel dysfunction. He has low back pain and urinary frequency. He recalls a fall this year during his exercise class.   PAST MEDICAL HISTORY: Past Medical History:  Diagnosis Date  . CVA (cerebral infarction)    on asa/plavix  . Depression   . Diverticulosis   . Horseshoe kidney   . Hydrocephalus    with shunt  . Hyperlipemia   . Hypertension   . Hypothyroid   . Renal cell carcinoma   . Restless leg syndrome     MEDICATIONS: Current Outpatient Medications on File Prior to Visit  Medication Sig Dispense Refill  . acetaminophen (TYLENOL) 500 MG tablet Take 1,000 mg by mouth 2 (two) times daily.    Marland Kitchen aspirin 325 MG tablet Take by mouth.    Marland Kitchen atorvastatin (LIPITOR) 20 MG tablet Take 40 mg by mouth daily.     Marland Kitchen dextran 70-hypromellose (TEARS RENEWED) ophthalmic solution Place 1 drop into both eyes at bedtime.     . DULoxetine (CYMBALTA) 60 MG capsule Take 60 mg  by mouth 2 (two) times daily.      . finasteride (PROSCAR) 5 MG tablet Take 5 mg by mouth daily.     Marland Kitchen gabapentin (NEURONTIN) 100 MG capsule Take 1 capsule (100 mg total) by mouth 2 (two) times daily. 60 capsule 11  . levothyroxine (SYNTHROID, LEVOTHROID) 100 MCG tablet Take 100 mcg by mouth daily before breakfast.     . modafinil (PROVIGIL) 200 MG tablet Take by mouth.    . Multiple Vitamin (MULTI-VITAMINS) TABS Take by mouth.    Marland Kitchen MYRBETRIQ 50 MG TB24 tablet     . pantoprazole (PROTONIX) 40 MG tablet Take 1 tablet (40 mg total) by mouth daily at 6 (six) AM. 30 tablet 0  . risedronate (ACTONEL) 35 MG tablet Take 35 mg by mouth 3 (three) times a week. with water on empty  stomach, nothing by mouth or lie down for next 30 minutes.takes on sunday    . Travoprost, BAK Free, (TRAVATAN) 0.004 % SOLN ophthalmic solution Place 1 drop into both eyes at bedtime.       No current facility-administered medications on file prior to visit.     ALLERGIES: Allergies  Allergen Reactions  . Cafergot Rash  . Celebrex [Celecoxib] Rash  . Lactose Rash  . Latex Rash    Sensitivity per pt  . Sulfa Antibiotics Rash    FAMILY HISTORY: Family History  Problem Relation Age of Onset  . Cancer Mother   . Leukemia Father     SOCIAL HISTORY: Social History   Socioeconomic History  . Marital status: Married    Spouse name: Not on file  . Number of children: Not on file  . Years of education: Not on file  . Highest education level: Not on file  Occupational History  . Not on file  Social Needs  . Financial resource strain: Not on file  . Food insecurity:    Worry: Not on file    Inability: Not on file  . Transportation needs:    Medical: Not on file    Non-medical: Not on file  Tobacco Use  . Smoking status: Former Research scientist (life sciences)  . Smokeless tobacco: Never Used  Substance and Sexual Activity  . Alcohol use: Yes    Alcohol/week: 1.0 standard drinks    Types: 1 drink(s) per week    Comment: occassional  . Drug use: No    Comment: quit in teens (smoked approx. 6 months)  . Sexual activity: Not on file  Lifestyle  . Physical activity:    Days per week: Not on file    Minutes per session: Not on file  . Stress: Not on file  Relationships  . Social connections:    Talks on phone: Not on file    Gets together: Not on file    Attends religious service: Not on file    Active member of club or organization: Not on file    Attends meetings of clubs or organizations: Not on file    Relationship status: Not on file  . Intimate partner violence:    Fear of current or ex partner: Not on file    Emotionally abused: Not on file    Physically abused: Not on file     Forced sexual activity: Not on file  Other Topics Concern  . 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     REVIEW OF SYSTEMS: Constitutional: No fevers, chills, or sweats, no generalized fatigue, change in appetite Eyes: No visual changes, double vision, eye pain Ear, nose and throat: No hearing loss, ear pain, nasal congestion, sore throat Cardiovascular: No chest pain, palpitations Respiratory:  No shortness of breath at rest or with exertion, wheezes GastrointestinaI: No nausea, vomiting, diarrhea, abdominal pain, fecal incontinence Genitourinary:  No dysuria, urinary retention or frequency Musculoskeletal:  No neck pain, back pain Integumentary: No rash, pruritus, skin lesions Neurological: as above Psychiatric: No depression, insomnia, anxiety Endocrine: No palpitations, fatigue, diaphoresis, mood swings, change in appetite, change in weight, increased thirst Hematologic/Lymphatic:  No anemia, purpura, petechiae. Allergic/Immunologic: no itchy/runny eyes, nasal congestion, recent allergic reactions, rashes  PHYSICAL EXAM: Vitals:   02/16/18 0927  BP: 134/90  Pulse: 76  SpO2: 96%   General: No acute distress, odd affect Head:  Normocephalic/atraumatic Neck: supple, no paraspinal tenderness, full range of motion Heart:  Regular rate and rhythm Lungs:  Clear to auscultation bilaterally Back: No paraspinal tenderness Skin/Extremities: No rash, no edema Neurological Exam: alert and oriented to person, place, and time. No aphasia or dysarthria. Fund of knowledge is appropriate.  Recent and remote memory are intact.  Attention and concentration are normal.    Able to name objects and repeat phrases. CDT 5/5.  MMSE - Mini Mental State Exam 02/16/2018  Orientation to time 5  Orientation to Place 5  Registration 3  Attention/ Calculation 5   Recall 2  Language- name 2 objects 2  Language- repeat 1  Language- follow 3 step command 3  Language- read & follow direction 1  Write a sentence 1  Copy design 1  Total score 29   Cranial nerves: Pupils equal, round, reactive to light.  Extraocular movements intact with no nystagmus. He has occasional episodes of upward eye rolling, noted on prior visit. Visual fields full. Facial sensation intact. No facial asymmetry. Tongue, uvula, palate midline.  Motor: Bulk and tone normal, muscle strength 5/5 throughout with no pronator drift.  Sensation to light touch intact.  No extinction to double simultaneous stimulation.  Finger to nose testing: minimal left ataxia.  Gait slow and cautious, no ataxia.   IMPRESSION: This is a 72 yo LH man with a history of left cerebellar stroke at age 69, hydrocephalus s/p VP shunt, renal cell carcinoma s/p left nephrectomy, hypertension, hyperlipidemia, who presented after a hospital visit in July 2019 for altered mental status. He reported more difficulty concentrating and increasing headaches. MRI brain in the ER showed a chronic left inferior cerebellar infarct, right ventriculostomy catheter in place, ventricles are small relative to the degree of atrophy, suggesting the possibility of overshunting. This was not felt to be the case by Neurosurgery evaluation at [facility]. He is quite difficult to redirect in the office and has an odd affect, MMSE normal 29/30. It is unclear what is an underlying personality disorder versus true cognitive dysfunction. He reports depression, we discussed how mood can affect memory and cause somatic symptoms. He was encouraged to continue follow-up with Psychiatry and therapy. Increase gabapentin to '100mg'$  in AM, '200mg'$  in PM for headache prophylaxis. He was given prn Axert through the [place]. He will follow-up in 6 months and knows to call for any changes.   Thank you for allowing me to participate in his care.  Please do not hesitate to  call for any questions or concerns.  The duration of this appointment  visit was 30 minutes of face-to-face time with the patient.  Greater than 50% of this time was spent in counseling, explanation of diagnosis, planning of further management, and coordination of care.   Ellouise Newer, M.D.   CC: Dr. Lynelle Doctor

## 2018-02-16 NOTE — Telephone Encounter (Signed)
Patient called and is needing his Gabapentin sent through the [place] and a 90 day supply. He said the Gabapentin had been increased at today's appointment. Thanks

## 2018-02-16 NOTE — Telephone Encounter (Signed)
Will fax Rx below to [facility] [phone] once signed.

## 2018-02-16 NOTE — Patient Instructions (Addendum)
1. Increase Gabapentin 100mg : Take 1 capsule in AM, 2 capsules at night  2. It is very important to work with your psychiatrist and therapist on your mood. Lousy mood can cause physical symptoms such as headaches and memory problems. Treating mood better will help you feel overall better. If you feel that the therapist at the [place] is not very helpful, we can send a referral to our [facility] therapists for you  3. Follow-up in 6 months, call for any changes   RECOMMENDATIONS FOR ALL PATIENTS WITH MEMORY PROBLEMS: 1. Continue to exercise (Recommend 30 minutes of walking everyday, or 3 hours every week) 2. Increase social interactions - continue going to [place] and enjoy social gatherings with friends and family 3. Eat healthy, avoid fried foods and eat more fruits and vegetables 4. Maintain adequate blood pressure, blood sugar, and blood cholesterol level. Reducing the risk of stroke and cardiovascular disease also helps promoting better memory. 5. Avoid stressful situations. Live a simple life and avoid aggravations. Organize your time and prepare for the next day in anticipation. 6. Sleep well, avoid any interruptions of sleep and avoid any distractions in the bedroom that may interfere with adequate sleep quality 7. Avoid sugar, avoid sweets as there is a strong link between excessive sugar intake, diabetes, and cognitive impairment The Mediterranean diet has been shown to help patients reduce the risk of progressive memory disorders and reduces cardiovascular risk. This includes eating fish, eat fruits and green leafy vegetables, nuts like almonds and hazelnuts, walnuts, and also use olive oil. Avoid fast foods and fried foods as much as possible. Avoid sweets and sugar as sugar use has been linked to worsening of memory function.

## 2018-02-16 NOTE — Telephone Encounter (Signed)
Gabapentin 100mg  Caps #270 with 3 refills Sig = Take 1 capsule AM, 2 capsules PM   Printed and placed on Dr. Amparo Bristol desk for signature.

## 2018-02-17 ENCOUNTER — Telehealth: Payer: Self-pay | Admitting: Neurology

## 2018-02-17 NOTE — Telephone Encounter (Signed)
Patient Gregory Kline needing to speak with you regarding his Gabapentin medication and to clear up some confusion in the directions. Please Call [phone]. Thanks

## 2018-02-17 NOTE — Telephone Encounter (Signed)
Spoke with pt.  He states that his Gabapentin was increased during yesterday's visit.  From 100mg  QHS to 100mg  AM and 200mg  QHS.  He asks if this is OK - to triple the daily dosage.  I advised that yes, it is, that this is still considered a very small dosage.  Pt just kept repeating "so I go from 100mg  to 300mg  there is no in between.  This does not seem right"  I advised that the Gabapentin is prescribed as a headache prophylaxis and that is the usual dosage for that type of Rx.  Pt still did not seem to understand but stated "I guess if you say so" and hung up

## 2018-02-17 NOTE — Telephone Encounter (Signed)
Patient is calling again.

## 2018-02-17 NOTE — Telephone Encounter (Signed)
Pt called office again.  Pt states that he only has 1 kidney and he is asking if the dose of Gabapentin 300mg /day could possibly over work his kidney.  I explained, again, that this is an incredibly low dose and is in fact a preferred medication for pt's with chronic kidney disease.  Pt expressed understanding.  Pt apologized for his rude behavior towards me and other staff in the office over the past 2 days.  I let him know that an apology was unnecessary but that I did appreciate it.

## 2018-05-03 ENCOUNTER — Other Ambulatory Visit: Payer: Self-pay | Admitting: Urology

## 2018-05-03 DIAGNOSIS — D49511 Neoplasm of unspecified behavior of right kidney: Secondary | ICD-10-CM

## 2018-05-25 ENCOUNTER — Ambulatory Visit
Admission: RE | Admit: 2018-05-25 | Discharge: 2018-05-25 | Disposition: A | Discharge status: Home / Self Care | Payer: Federal, State, Local not specified - PPO | Source: Ambulatory Visit | Attending: Urology | Admitting: Urology

## 2018-05-25 ENCOUNTER — Other Ambulatory Visit: Payer: Self-pay | Admitting: Urology

## 2018-05-25 DIAGNOSIS — D49511 Neoplasm of unspecified behavior of right kidney: Secondary | ICD-10-CM

## 2018-09-28 ENCOUNTER — Ambulatory Visit: Payer: Federal, State, Local not specified - PPO | Admitting: Neurology

## 2018-10-02 ENCOUNTER — Other Ambulatory Visit: Payer: Self-pay

## 2018-10-02 ENCOUNTER — Encounter: Payer: Self-pay | Admitting: Neurology

## 2018-10-02 ENCOUNTER — Telehealth (INDEPENDENT_AMBULATORY_CARE_PROVIDER_SITE_OTHER): Payer: Federal, State, Local not specified - PPO | Admitting: Neurology

## 2018-10-02 VITALS — Ht 69.0 in | Wt 180.0 lb

## 2018-10-02 DIAGNOSIS — R413 Other amnesia: Secondary | ICD-10-CM

## 2018-10-02 DIAGNOSIS — R51 Headache: Secondary | ICD-10-CM

## 2018-10-02 DIAGNOSIS — R519 Headache, unspecified: Secondary | ICD-10-CM

## 2018-10-02 MED ORDER — GABAPENTIN 100 MG PO CAPS: ORAL_CAPSULE | ORAL | 3 refills | Status: DC

## 2018-10-02 NOTE — Progress Notes (Signed)
Virtual Visit via Telephone Note The purpose of this virtual visit is to provide medical care while limiting exposure to the novel coronavirus.    Consent was obtained for phone visit:  Yes.   Answered questions that patient had about telehealth interaction:  Yes.   I discussed the limitations, risks, security and privacy concerns of performing an evaluation and management service by telephone. I also discussed with the patient that there may be a patient responsible charge related to this service. The patient expressed understanding and agreed to proceed.  Pt location: Home Physician Location: office Name of referring provider:  [facility] I connected with .Gregory Kline at patients initiation/request on 10/02/2018 at  3:00 PM EDT by telephone and verified that I am speaking with the correct person using two identifiers.  Pt MRN:  [id number] Pt DOB:  1945-07-28   History of Present Illness:  The patient was last seen in November 2019. He was in the hospital in July 2019 for difficulty concentrating and increasing headaches. MRI brain did not show any acute changes. There was note of  a chronic left inferior cerebellar infarct, right ventriculostomy catheter in place, ventricles are small relative to the degree of atrophy, suggesting the possibility of overshunting. He was evaluated by Neurosurgery at [facility] and it was felt that his shunt is over 15 years old and may or may not be functional, however if he had shunt failure, it would be ventriculomegaly and headaches would be positional. He had an EEG which was mildly abnormal due to breach artifact over the right frontal region, no epileptiform discharges or slowing seen. Gabapentin dose was increased for continued headaches, he has been taking gabapentin 100mg  in AM, 200mg  in PM and states that it is working well. Headaches are mostly triggered by something but do not occur on a regular basis or out of the blue. No side effects.  He feels his focus and concentration are fair, not what he would like. He denies getting lost driving. He and his wife do their medications together, his wife manages finances. He is somewhat slow to respond to questions but able to answer coherently, saying I'm just going through stuff." He hurt himself playing with his grandchildren and is "out of whack" with that, with pain in his lower back, shoulders, and side. Sleep is good, "it is my escape mechanism."   History on Initial Assessment 10/25/2017: This is a pleasant 73 year old right-handed man with a history of left cerebellar stroke at age 83, hydrocephalus s/p VP shunt, renal cell carcinoma s/p left nephrectomy, hypertension, hyperlipidemia, presenting after hospital visit for altered mental status. His wife reports that over the past few months, he has had more trouble concentrating and difficulty staying on track. They deny any nonsensical speech. He dozes off to sleep frequently. He has also been having more headaches, she reports that he has had headaches for several years, usually getting better by spring time, but recently he has gone through a surge of clusters of headaches lasting 2-3 days at a time. Longest headache-free interval is 2-3 days. Headaches are usually worse in the Fall, but she states things have changed. He describes different kinds of pain on either temporal region and around his eyes lasting several hours. Acetaminophen+caffeine do not help. There is no associated nausea/vomiting. Sometimes he is sensitive to lights and sounds. He states it has affected his mood as well, "I don't like me with it," depression is worse, he gets  irritated more easily. At baseline, he has repetitive upward eye rolling movements, but she states that these have also increased in frequency since December 2018. Due to worsening symptoms, he went to the ER on 10/20/2017 where he had a head CT and MRI brain without contrast which I personally reviewed, no  acute changes seen, there was chronic left inferior cerebellar infarct, right ventriculostomy catheter in place, ventricles are small relative to the degree of atrophy, suggesting the possibility of overshunting. Images were reviewed by neurosurgeon Dr. [name] who stated that this is likely a chronic finding and shunt has likely been like this for many years, recommending outpatient neurosurgery follow-up. There is note of a prior MRI brain done 05/2015, images unavailable for review, with right frontal approach ventricular shunt with ventricles appearing relatively decompressed relative to degree of cerebral volume loss.  His wife denies any staring episodes. Sometimes he does not respond but she feels this is due to his hearing loss. They recall being told in 2017 that there was a slight blockage in his VP shunt, but now were told there was "more than one place blocked." He denies any dizziness, diplopia, dysarthria/dysphagia, neck pain, focal numbness/tingling/weakness, bowel dysfunction. He has low back pain and urinary frequency. He recalls a fall this year during his exercise class.     Observations/Objective:  Limited due to nature of phone visit. Patient is awake, alert, oriented x 3. He states he did not do well with memory testing due to being in a lot of pain today. Montreal Cognitive Assessment Blind 10/02/2018  Attention: Read list of digits (0/2) 2  Attention: Read list of letters (0/1) 1  Attention: Serial 7 subtraction starting at 100 (0/3) 1  Language: Repeat phrase (0/2) 2  Language : Fluency (0/1) 1  Abstraction (0/2) 1  Delayed Recall (0/5) 2  Orientation (0/6) 5  Total 15/22    Assessment and Plan:   This is a 73 yo LH man with a history of left cerebellar stroke at age 12, hydrocephalus s/p VP shunt, renal cell carcinoma s/p left nephrectomy, hypertension, hyperlipidemia, who presented after a hospital visit in July 2019 for altered mental status. He reported more difficulty  concentrating and increasing headaches. MRI brain in the ER showed a chronic left inferior cerebellar infarct, right ventriculostomy catheter in place, ventricles are small relative to the degree of atrophy, suggesting the possibility of overshunting. This was not felt to be the case by Neurosurgery evaluation at [facility]. He reports improvement in headaches with gabapentin, continue 100mg  in AM, 200mg  in PM. He feels cognition is fair, MOCA blind (done over phone) today 15/22, although he states he did not do well due to being in a lot of pain. No difficulties with complex tasks, continue to monitor. He will follow-up in 6 months and knows to call for any changes.    Follow Up Instructions:    -I discussed the assessment and treatment plan with the patient. The patient was provided an opportunity to ask questions and all were answered. The patient agreed with the plan and demonstrated an understanding of the instructions.   The patient was advised to call back or seek an in-person evaluation if the symptoms worsen or if the condition fails to improve as anticipated.    Cameron Sprang, MD

## 2018-10-10 ENCOUNTER — Other Ambulatory Visit: Payer: Self-pay | Admitting: Family Medicine

## 2018-10-10 DIAGNOSIS — S22080A Wedge compression fracture of T11-T12 vertebra, initial encounter for closed fracture: Secondary | ICD-10-CM

## 2018-10-17 ENCOUNTER — Other Ambulatory Visit: Payer: Federal, State, Local not specified - PPO

## 2018-10-18 ENCOUNTER — Ambulatory Visit
Admission: RE | Admit: 2018-10-18 | Discharge: 2018-10-18 | Disposition: A | Discharge status: Home / Self Care | Payer: Federal, State, Local not specified - PPO | Source: Ambulatory Visit | Attending: Family Medicine | Admitting: Family Medicine

## 2018-10-18 ENCOUNTER — Other Ambulatory Visit: Payer: Federal, State, Local not specified - PPO

## 2018-10-18 ENCOUNTER — Other Ambulatory Visit: Payer: Self-pay

## 2018-10-18 DIAGNOSIS — S22080A Wedge compression fracture of T11-T12 vertebra, initial encounter for closed fracture: Secondary | ICD-10-CM

## 2018-10-19 ENCOUNTER — Other Ambulatory Visit: Payer: Self-pay | Admitting: Urology

## 2018-10-19 DIAGNOSIS — D49511 Neoplasm of unspecified behavior of right kidney: Secondary | ICD-10-CM

## 2018-11-23 ENCOUNTER — Ambulatory Visit
Admission: RE | Admit: 2018-11-23 | Discharge: 2018-11-23 | Disposition: A | Discharge status: Home / Self Care | Payer: Federal, State, Local not specified - PPO | Source: Ambulatory Visit | Attending: Urology | Admitting: Urology

## 2018-11-23 ENCOUNTER — Other Ambulatory Visit: Payer: Self-pay

## 2018-11-23 DIAGNOSIS — D49511 Neoplasm of unspecified behavior of right kidney: Secondary | ICD-10-CM

## 2018-11-23 MED ORDER — GADOBENATE DIMEGLUMINE 529 MG/ML IV SOLN
8.0000 mL | Freq: Once | INTRAVENOUS | Status: AC | PRN
  Administered 2018-11-23: 8 mL via INTRAVENOUS

## 2018-12-29 ENCOUNTER — Telehealth: Payer: Self-pay | Admitting: Neurology

## 2018-12-29 DIAGNOSIS — F329 Major depressive disorder, single episode, unspecified: Secondary | ICD-10-CM

## 2018-12-29 DIAGNOSIS — R410 Disorientation, unspecified: Secondary | ICD-10-CM

## 2018-12-29 DIAGNOSIS — R519 Headache, unspecified: Secondary | ICD-10-CM

## 2018-12-29 DIAGNOSIS — Z982 Presence of cerebrospinal fluid drainage device: Secondary | ICD-10-CM

## 2018-12-29 DIAGNOSIS — F32A Depression, unspecified: Secondary | ICD-10-CM

## 2018-12-29 DIAGNOSIS — R413 Other amnesia: Secondary | ICD-10-CM

## 2018-12-29 MED ORDER — GABAPENTIN 100 MG PO CAPS: ORAL_CAPSULE | ORAL | 3 refills | Status: DC

## 2018-12-29 NOTE — Telephone Encounter (Signed)
Done - sent script to [facility]

## 2018-12-29 NOTE — Telephone Encounter (Signed)
Patient called regarding his Gabapentin 300 MG per day. He said to please only send the Pres[facility] to [facility] [address]. He said not to send it through the [place] because it is too expensive. He would like you to add this pharmacy to his chart please. Thank you

## 2019-01-01 ENCOUNTER — Other Ambulatory Visit: Payer: Self-pay | Admitting: *Deleted

## 2019-01-01 DIAGNOSIS — Z982 Presence of cerebrospinal fluid drainage device: Secondary | ICD-10-CM

## 2019-01-01 DIAGNOSIS — R413 Other amnesia: Secondary | ICD-10-CM

## 2019-01-01 DIAGNOSIS — F329 Major depressive disorder, single episode, unspecified: Secondary | ICD-10-CM

## 2019-01-01 DIAGNOSIS — R410 Disorientation, unspecified: Secondary | ICD-10-CM

## 2019-01-01 DIAGNOSIS — F32A Depression, unspecified: Secondary | ICD-10-CM

## 2019-01-01 DIAGNOSIS — R519 Headache, unspecified: Secondary | ICD-10-CM

## 2019-01-01 MED ORDER — GABAPENTIN 100 MG PO CAPS: ORAL_CAPSULE | ORAL | 3 refills | Status: DC

## 2019-03-12 ENCOUNTER — Telehealth: Payer: Self-pay | Admitting: Neurology

## 2019-03-12 NOTE — Telephone Encounter (Signed)
Patient just wanted [place] to know that he is not getting the Gabapentin right now

## 2019-04-18 ENCOUNTER — Ambulatory Visit: Payer: Federal, State, Local not specified - PPO | Attending: Internal Medicine

## 2019-04-18 DIAGNOSIS — Z23 Encounter for immunization: Secondary | ICD-10-CM

## 2019-04-18 NOTE — Progress Notes (Signed)
   Covid-19 Vaccination Clinic  Name:  Gregory Kline    MRN: [id number] DOB: 1945-06-24  04/18/2019  Mr. Gregory Kline was observed post Covid-19 immunization for 15 minutes without incidence. He was provided with Vaccine Information Sheet and instruction to access the V-Safe system.   Mr. Gregory Kline was instructed to call 911 with any severe reactions post vaccine: Marland Kitchen Difficulty breathing  . Swelling of your face and throat  . A fast heartbeat  . A bad rash all over your body  . Dizziness and weakness    Immunizations Administered    Name Date Dose VIS Date Route   Pfizer COVID-19 Vaccine 04/18/2019  4:52 PM 0.3 mL 03/09/2019 Intramuscular   Manufacturer: [place]   Lot: [id number]   [place]: [id number]

## 2019-05-07 ENCOUNTER — Ambulatory Visit: Payer: Federal, State, Local not specified - PPO | Attending: Internal Medicine

## 2019-05-07 DIAGNOSIS — Z23 Encounter for immunization: Secondary | ICD-10-CM

## 2019-05-07 NOTE — Progress Notes (Signed)
   Covid-19 Vaccination Clinic  Name:  Gregory Kline    MRN: [id number] DOB: 08/29/1945  05/07/2019  [name] was observed post Covid-19 immunization for 15 minutes without incidence. He was provided with Vaccine Information Sheet and instruction to access the V-Safe system.   [name] was instructed to call 911 with any severe reactions post vaccine: Marland Kitchen Difficulty breathing  . Swelling of your face and throat  . A fast heartbeat  . A bad rash all over your body  . Dizziness and weakness    Immunizations Administered    Name Date Dose VIS Date Route   Pfizer COVID-19 Vaccine 05/07/2019 10:40 AM 0.3 mL 03/09/2019 Intramuscular   Manufacturer: [place]   Lot: [id number]   [place]: [id number]

## 2019-05-08 ENCOUNTER — Other Ambulatory Visit: Payer: Self-pay

## 2019-05-08 ENCOUNTER — Telehealth (INDEPENDENT_AMBULATORY_CARE_PROVIDER_SITE_OTHER): Payer: Federal, State, Local not specified - PPO | Admitting: Neurology

## 2019-05-08 ENCOUNTER — Encounter: Payer: Self-pay | Admitting: Neurology

## 2019-05-08 VITALS — Ht 70.0 in | Wt 180.0 lb

## 2019-05-08 DIAGNOSIS — G44219 Episodic tension-type headache, not intractable: Secondary | ICD-10-CM | POA: Diagnosis not present

## 2019-05-08 NOTE — Progress Notes (Signed)
Virtual Visit via Video Note The purpose of this virtual visit is to provide medical care while limiting exposure to the novel coronavirus.    Consent was obtained for video visit:  Yes.   Answered questions that patient had about telehealth interaction:  Yes.   I discussed the limitations, risks, security and privacy concerns of performing an evaluation and management service by telemedicine. I also discussed with the patient that there may be a patient responsible charge related to this service. The patient expressed understanding and agreed to proceed.  Pt location: Home Physician Location: office Name of referring provider:  [facility] I connected with Gregory Kline at patients initiation/request on 05/08/2019 at  3:30 PM EST by video enabled telemedicine application and verified that I am speaking with the correct person using two identifiers. Pt MRN:  [id number] Pt DOB:  1945/04/22 Video Participants:  Gregory Kline Gregory Kline;  Gregory Kline (spouse)   History of Present Illness:  The patient was seen as a virtual video visit on 05/08/2019. He was last seen in the neurology clinic 7 months ago. He was in the hospital in July 2019 for difficulty concentrating and increasing headaches. MRI brain did not show any acute changes. There was note of  a chronic left inferior cerebellar infarct, right ventriculostomy catheter in place, ventricles are small relative to the degree of atrophy, suggesting the possibility of overshunting. He was evaluated by Neurosurgery at [facility] and it was felt that his shunt is over 97 years old and may or may not be functional, however if he had shunt failure, it would be ventriculomegaly and headaches would be positional. He had an EEG which was mildly abnormal due to breach artifact over the right frontal region, no epileptiform discharges or slowing seen. His gabapentin dose was increased, with improvement in headaches, he states he has had only one bad  headache since his last visit. No side effects on gabapentin 100mg  in AM, 200mg  in PM. He feels he sleeps too much. He is going through depression and sees his [place] doctor, started on Cymbalta. When asked about memory, he states he and his wife have different issues with memory. He denies missing medications and denies getting lost driving. He denies misplacing things. His wife manages finances. His wife reports some days are better than other. She reports he spends a great deal of time sleeping, sleep at night is good.   History on Initial Assessment 10/25/2017: This is a pleasant 74 year old right-handed man with a history of left cerebellar stroke at age 79, hydrocephalus s/p VP shunt, renal cell carcinoma s/p left nephrectomy, hypertension, hyperlipidemia, presenting after hospital visit for altered mental status. His wife reports that over the past few months, he has had more trouble concentrating and difficulty staying on track. They deny any nonsensical speech. He dozes off to sleep frequently. He has also been having more headaches, she reports that he has had headaches for several years, usually getting better by spring time, but recently he has gone through a surge of clusters of headaches lasting 2-3 days at a time. Longest headache-free interval is 2-3 days. Headaches are usually worse in the Fall, but she states things have changed. He describes different kinds of pain on either temporal region and around his eyes lasting several hours. Acetaminophen+caffeine do not help. There is no associated nausea/vomiting. Sometimes he is sensitive to lights and sounds. He states it has affected his mood as well, "I don't like me with  it," depression is worse, he gets irritated more easily. At baseline, he has repetitive upward eye rolling movements, but she states that these have also increased in frequency since December 2018. Due to worsening symptoms, he went to the ER on 10/20/2017 where he had a head CT and MRI  brain without contrast which I personally reviewed, no acute changes seen, there was chronic left inferior cerebellar infarct, right ventriculostomy catheter in place, ventricles are small relative to the degree of atrophy, suggesting the possibility of overshunting. Images were reviewed by neurosurgeon Dr. [name] who stated that this is likely a chronic finding and shunt has likely been like this for many years, recommending outpatient neurosurgery follow-up. There is note of a prior MRI brain done 05/2015, images unavailable for review, with right frontal approach ventricular shunt with ventricles appearing relatively decompressed relative to degree of cerebral volume loss.  His wife denies any staring episodes. Sometimes he does not respond but she feels this is due to his hearing loss. They recall being told in 2017 that there was a slight blockage in his VP shunt, but now were told there was "more than one place blocked." He denies any dizziness, diplopia, dysarthria/dysphagia, neck pain, focal numbness/tingling/weakness, bowel dysfunction. He has low back pain and urinary frequency. He recalls a fall this year during his exercise class.       Current Outpatient Medications on File Prior to Visit  Medication Sig Dispense Refill  . acetaminophen (TYLENOL) 500 MG tablet Take 1,000 mg by mouth 2 (two) times daily.    Marland Kitchen aspirin EC 81 MG tablet Take 81 mg by mouth daily.    Marland Kitchen atorvastatin (LIPITOR) 20 MG tablet Take 40 mg by mouth daily.     Marland Kitchen dextran 70-hypromellose (TEARS RENEWED) ophthalmic solution Place 1 drop into both eyes at bedtime.     . DULoxetine (CYMBALTA) 60 MG capsule Take 60 mg by mouth 2 (two) times daily.      . finasteride (PROSCAR) 5 MG tablet Take 5 mg by mouth daily.     Marland Kitchen gabapentin (NEURONTIN) 100 MG capsule Take 1 capsule in AM, 2 capsules in PM 270 capsule 3  . levothyroxine (SYNTHROID, LEVOTHROID) 100 MCG tablet Take 100 mcg by mouth daily before breakfast.     .  modafinil (PROVIGIL) 200 MG tablet Take by mouth.    . Multiple Vitamin (MULTI-VITAMINS) TABS Take by mouth.    Marland Kitchen MYRBETRIQ 50 MG TB24 tablet     . pantoprazole (PROTONIX) 40 MG tablet Take 40 mg by mouth daily.    . risedronate (ACTONEL) 35 MG tablet Take 35 mg by mouth 3 (three) times a week. with water on empty stomach, nothing by mouth or lie down for next 30 minutes.takes on sunday    . Travoprost, BAK Free, (TRAVATAN) 0.004 % SOLN ophthalmic solution Place 1 drop into both eyes at bedtime.       No current facility-administered medications on file prior to visit.     Observations/Objective:   Vitals:   05/08/19 1007  Weight: 180 lb (81.6 kg)  Height: 5\' 10"  (1.778 m)   GEN:  The patient appears stated age and is in NAD.  Neurological examination: Patient is awake, alert, oriented x 3. No aphasia or dysarthria. Intact fluency and comprehension. Remote and recent memory intact.Cranial nerves: Extraocular movements intact with no nystagmus. No facial asymmetry. Motor: moves all extremities symmetrically, at least anti-gravity x 4.    Assessment and Plan:   This is a  74 yo LH man with a history of left cerebellar stroke at age 31, hydrocephalus s/p VP shunt, renal cell carcinoma s/p left nephrectomy, hypertension, hyperlipidemia, who presented after a hospital visit in July 2019 for altered mental status. He reported more difficulty concentrating and increasing headaches. MRI brain in the ER showed a chronic left inferior cerebellar infarct, right ventriculostomy catheter in place, ventricles are small relative to the degree of atrophy, suggesting the possibility of overshunting. This was not felt to be the case by Neurosurgery evaluation at [facility]. Headaches much improved with gabapentin, he denies any significant headaches and reports daytime drowsiness. He will try stopping the AM dose of gabapentin and continue 200mg  qhs. He will ask about a sleep study through his [place] physicians. Memory  appears stable, no difficulties with complex tasks. He will follow-up in 6 months and knows to call for any changes.    Follow Up Instructions:   -I discussed the assessment and treatment plan with the patient. The patient was provided an opportunity to ask questions and all were answered. The patient agreed with the plan and demonstrated an understanding of the instructions.   The patient was advised to call back or seek an in-person evaluation if the symptoms worsen or if the condition fails to improve as anticipated.    Cameron Sprang, MD

## 2019-05-16 MED ORDER — GABAPENTIN 100 MG PO CAPS: ORAL_CAPSULE | ORAL | 3 refills | Status: DC

## 2019-12-06 ENCOUNTER — Other Ambulatory Visit (HOSPITAL_COMMUNITY): Payer: Self-pay | Admitting: Urology

## 2019-12-06 ENCOUNTER — Other Ambulatory Visit: Payer: Self-pay | Admitting: Urology

## 2019-12-06 DIAGNOSIS — D49511 Neoplasm of unspecified behavior of right kidney: Secondary | ICD-10-CM

## 2019-12-10 ENCOUNTER — Ambulatory Visit: Payer: Federal, State, Local not specified - PPO | Admitting: Neurology

## 2019-12-12 ENCOUNTER — Encounter (HOSPITAL_COMMUNITY): Payer: Self-pay

## 2019-12-12 ENCOUNTER — Ambulatory Visit (HOSPITAL_COMMUNITY): Admission: RE | Admit: 2019-12-12 | Payer: Federal, State, Local not specified - PPO | Source: Ambulatory Visit

## 2020-02-07 ENCOUNTER — Other Ambulatory Visit: Payer: Self-pay | Admitting: Neurology

## 2020-02-07 DIAGNOSIS — G44219 Episodic tension-type headache, not intractable: Secondary | ICD-10-CM

## 2020-08-29 ENCOUNTER — Encounter (HOSPITAL_COMMUNITY): Payer: Self-pay

## 2020-08-29 ENCOUNTER — Ambulatory Visit (HOSPITAL_COMMUNITY)
Admission: EM | Admit: 2020-08-29 | Discharge: 2020-08-29 | Disposition: A | Discharge status: Home / Self Care | Payer: Federal, State, Local not specified - PPO | Attending: Internal Medicine | Admitting: Internal Medicine

## 2020-08-29 ENCOUNTER — Other Ambulatory Visit: Payer: Self-pay

## 2020-08-29 ENCOUNTER — Telehealth (HOSPITAL_COMMUNITY): Payer: Self-pay

## 2020-08-29 DIAGNOSIS — M5431 Sciatica, right side: Secondary | ICD-10-CM | POA: Diagnosis not present

## 2020-08-29 DIAGNOSIS — B356 Tinea cruris: Secondary | ICD-10-CM

## 2020-08-29 MED ORDER — TERBINAFINE HCL 250 MG PO TABS: 250.0000 mg | ORAL_TABLET | Freq: Every day | ORAL | 0 refills | Status: DC

## 2020-08-29 MED ORDER — MICONAZOLE NITRATE 2 % POWD: 0 refills | Status: DC

## 2020-08-29 NOTE — ED Triage Notes (Signed)
Pt reports that "for quite awhile" (posibly several weeks), he has been having penile irritation. Pt has been using vaseline and medicated powder with no relief.  Pt also c/o right leg numbness with intermittent pain, starting about one week ago. States feels this from hip down his leg. Pt reports his sense of balance is off as well but unable to state whether this is due to pain.  Pt also reports hearing troubles but elaborates to state he just needs new hearing aids which wife attests to. Delayed responses noted throughout triage.   Pt also c/o runny nose.

## 2020-08-29 NOTE — ED Provider Notes (Signed)
[place]    CSN: [id number] Arrival date & time: 08/29/20  1149      History   Chief Complaint Chief Complaint  Patient presents with  . Penile irritation  . Leg Numbness/Pain  . Hearing Problem  . Balance Issues  . Nasal Congestion    HPI Gregory Kline is a 75 y.o. male comes to the urgent care with complaints of itchy rash in the groin and irritation of the shaft and head of penis.  Symptoms started a few weeks ago and has been persistent.  Patient has tried Vaseline and medicated powder with no relief.  He denies any dysuria urgency or frequency.  Patient has some urinary urgency with occasional incontinence.  I suspect that this is making the groin area moist/wet.  He also complains of lower back pain which radiates into the right leg.  It is associated with some numbness in the right leg.  He sustained a fall a couple of years ago was playing with his grandson.  That triggered the back pain which has been persistent till today.  He has tried Voltaren gel with partial relief.  No weakness in the right leg.  His balance is somewhat off but that is also chronic.  He complains of chronic nasal discharge.  Rhinorrhea is clear.   HPI  Past Medical History:  Diagnosis Date  . CVA (cerebral infarction)    on asa/plavix  . Depression   . Diverticulosis   . Horseshoe kidney   . 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 Hyperlipemia   . Hypertension   . Hypothyroid   . Renal cell carcinoma   . Restless leg syndrome     Patient Active Problem List   Diagnosis Date Noted  . Migraine headache 02/23/2011  . Hypophosphatemia 02/21/2011  . Leukocytosis 02/19/2011  . Hypotension 02/18/2011  . Lower GI bleed 02/17/2011  . Chronic kidney disease, stage III (moderate) ([place]) 02/17/2011  . Hypertension 02/17/2011  . Hypothyroidism 02/17/2011  . History of stroke 02/17/2011    Past Surgical History:  Procedure Laterality Date  . CERVICAL SPINE SURGERY     benign  tumor  . COLONOSCOPY  02/21/2011   Procedure: COLONOSCOPY;  Surgeon: Lafayette Dragon, MD;  Location: [facility];  Service: Endoscopy;  Laterality: N/A;  . ESOPHAGOGASTRODUODENOSCOPY  02/19/2011   Procedure: ESOPHAGOGASTRODUODENOSCOPY (EGD);  Surgeon: Juanita Craver, MD;  Location: [facility];  Service: Endoscopy;  Laterality: N/A;  . FLEXIBLE SIGMOIDOSCOPY  02/19/2011   Procedure: FLEXIBLE SIGMOIDOSCOPY;  Surgeon: Juanita Craver, MD;  Location: [facility];  Service: Endoscopy;  Laterality: N/A;  . KIDNEY SURGERY    . PARATHYROIDECTOMY    . PARTIAL COLECTOMY    . POLYPECTOMY  02/21/2011   Procedure: POLYPECTOMY;  Surgeon: Lafayette Dragon, MD;  Location: [facility];  Service: Endoscopy;  Laterality: Left;  . THYROIDECTOMY, PARTIAL         Home Medications    Prior to Admission medications   Medication Sig Start Date End Date Taking? Authorizing Provider  acetaminophen (TYLENOL) 500 MG tablet Take 1,000 mg by mouth 2 (two) times daily.   Yes [provider]  amitriptyline (ELAVIL) 25 MG tablet Take 25 mg by mouth at bedtime.   Yes [provider]  ARIPiprazole (ABILIFY) 10 MG tablet Take 10 mg by mouth at bedtime.   Yes [provider]  aspirin EC 81 MG tablet Take 81 mg by mouth daily.   Yes [provider]  atorvastatin (LIPITOR) 20 MG tablet Take 40 mg by mouth daily.   Yes [provider]  DULoxetine (CYMBALTA) 60 MG capsule Take 60 mg by mouth 2 (two) times daily.   Yes [provider]  finasteride (PROSCAR) 5 MG tablet Take 5 mg by mouth daily.   Yes [provider]  gabapentin (NEURONTIN) 100 MG capsule Take 2 capsules in PM 05/16/19  Yes Cameron Sprang, MD  levothyroxine (SYNTHROID, LEVOTHROID) 100 MCG tablet Take 100 mcg by mouth daily before breakfast.   Yes [provider]  lisinopril (ZESTRIL) 10 MG tablet Take 10 mg by mouth daily.   Yes [provider]  Miconazole Nitrate 2 % POWD Apply to  affected area twice daily for 2 weeks 08/29/20  Yes Gregroy Dombkowski, Myrene Galas, MD  MYRBETRIQ 50 MG TB24 tablet  08/17/17  Yes [provider]  pantoprazole (PROTONIX) 40 MG tablet Take 40 mg by mouth daily.   Yes [provider]  SUMAtriptan (IMITREX) 100 MG tablet Take 100 mg by mouth 2 (two) times daily as needed for migraine.   Yes [provider]  tamsulosin (FLOMAX) 0.4 MG CAPS capsule Take 0.8 mg by mouth.   Yes [provider]  terbinafine (LAMISIL) 250 MG tablet Take 1 tablet (250 mg total) by mouth daily. 08/29/20  Yes Isak Sotomayor, Myrene Galas, MD  UNABLE TO FIND Med Name: Acetaminophen-Butal-Caffeine [phone] Per daughter report, 2 pills twice daily as needed   Yes [provider]  dextran 70-hypromellose (TEARS RENEWED) ophthalmic solution Place 1 drop into both eyes at bedtime.     [provider]  modafinil (PROVIGIL) 200 MG tablet Take by mouth.    [provider]  Multiple Vitamin (MULTI-VITAMINS) TABS Take by mouth.    [provider]  risedronate (ACTONEL) 35 MG tablet Take 35 mg by mouth 3 (three) times a week. with water on empty stomach, nothing by mouth or lie down for next 30 minutes.takes on sunday    [provider]  Travoprost, BAK Free, (TRAVATAN) 0.004 % SOLN ophthalmic solution Place 1 drop into both eyes at bedtime.      [provider]    Family History Family History  Problem Relation Age of Onset  . Cancer Mother   . Leukemia Father     Social History Social History   Tobacco Use  . Smoking status: Former Research scientist (life sciences)  . Smokeless tobacco: Never Used  Vaping Use  . Vaping Use: Never used  Substance Use Topics  . Alcohol use: Yes    Alcohol/week: 1.0 standard drink    Types: 1 drink(s) per week    Comment: occassional  . Drug use: No    Comment: quit in teens (smoked approx. 6 months)     Allergies   Cafergot, Celebrex [celecoxib], Lactose, Latex, and Sulfa antibiotics   Review  of Systems Review of Systems  Constitutional: Negative.   HENT: Positive for congestion and rhinorrhea.   Respiratory: Negative.   Gastrointestinal: Negative.   Genitourinary: Negative for dysuria, frequency and urgency.  Neurological: Negative.      Physical Exam Triage Vital Signs ED Triage Vitals  Enc Vitals Group     BP 08/29/20 1306 112/74     Pulse Rate 08/29/20 1306 89     Resp 08/29/20 1306 18     Temp 08/29/20 1306 97.9 F (36.6 C)     Temp src --      SpO2 08/29/20 1306 96 %  Weight --      Height --      Head Circumference --      Peak Flow --      Pain Score 08/29/20 1247 5     Pain Loc --      Pain Edu? --      Excl. in [place]? --    No data found.  Updated Vital Signs BP 112/74   Pulse 89   Temp 97.9 F (36.6 C)   Resp 18   SpO2 96%   Visual Acuity Right Eye Distance:   Left Eye Distance:   Bilateral Distance:    Right Eye Near:   Left Eye Near:    Bilateral Near:     Physical Exam Vitals and nursing note reviewed.  Constitutional:      General: He is not in acute distress.    Appearance: He is not ill-appearing.  HENT:     Right Ear: Tympanic membrane normal.     Left Ear: Tympanic membrane normal.  Cardiovascular:     Rate and Rhythm: Normal rate and regular rhythm.     Pulses: Normal pulses.     Heart sounds: Normal heart sounds.  Pulmonary:     Effort: Pulmonary effort is normal.     Breath sounds: Normal breath sounds.  Genitourinary:    Comments: Irritation around the head of the penis and the distal shaft of the penis.  Fungal rash in the groin. Neurological:     Mental Status: He is alert.      UC Treatments / Results  Labs (all labs ordered are listed, but only abnormal results are displayed) Labs Reviewed - No data to display  EKG   Radiology No results found.  Procedures Procedures (including critical care time)  Medications Ordered in UC Medications - No data to display  Initial Impression / Assessment  and Plan / UC Course  I have reviewed the triage vital signs and the nursing notes.  Pertinent labs & imaging results that were available during my care of the patient were reviewed by me and considered in my medical decision making (see chart for details).     1 tinea cruris: Lamisil 250 mg orally daily for 10 days Miconazole powder to apply in the affected areas twice daily for 10 days  2.  Right-sided sciatica: Continue Voltaren gel Tylenol as needed for pain Heating pad will help with back pain This is a chronic condition and will resolve completely. Final Clinical Impressions(s) / UC Diagnoses   Final diagnoses:  Tinea cruris  Right sided sciatica     Discharge Instructions     Please use medications as prescribed Gentle range of motion exercises Heating pad for your lower back on a 10-minute on-10 minutes off cycle twice daily Take Tylenol as needed for pain.  Continue to use the Voltaren gel.   ED Prescriptions    Medication Sig Dispense Auth. Provider   terbinafine (LAMISIL) 250 MG tablet Take 1 tablet (250 mg total) by mouth daily. 10 tablet Janaki Exley, Myrene Galas, MD   Miconazole Nitrate 2 % POWD Apply to affected area twice daily for 2 weeks 100 g Cobain Morici, Myrene Galas, MD     PDMP not reviewed this encounter.   Chase Picket, MD 08/29/20 [phone]

## 2020-08-29 NOTE — Discharge Instructions (Signed)
Please use medications as prescribed Gentle range of motion exercises Heating pad for your lower back on a 10-minute on-10 minutes off cycle twice daily Take Tylenol as needed for pain.  Continue to use the Voltaren gel.

## 2020-09-08 ENCOUNTER — Telehealth: Payer: Self-pay | Admitting: Neurology

## 2020-09-08 ENCOUNTER — Other Ambulatory Visit: Payer: Self-pay

## 2020-09-08 DIAGNOSIS — G44219 Episodic tension-type headache, not intractable: Secondary | ICD-10-CM

## 2020-09-08 MED ORDER — GABAPENTIN 100 MG PO CAPS: ORAL_CAPSULE | ORAL | 0 refills | Status: DC

## 2020-09-08 NOTE — Telephone Encounter (Signed)
Patient called with his daughter Lyda Perone on the line to request a prescription for gabapentin 100 MG to be sent to [facility] [address].  They are needing it to last until his next appointment on 10/24/20 at 8:30 AM.

## 2020-09-08 NOTE — Telephone Encounter (Signed)
Script sent in for pt to last until appt

## 2020-10-08 ENCOUNTER — Telehealth: Payer: Self-pay | Admitting: Neurology

## 2020-10-08 NOTE — Telephone Encounter (Signed)
Called and lm with an regarding a refill for gabapentin. Was told he should have enough till his apt, will check and call us back if need to

## 2020-10-24 ENCOUNTER — Encounter: Payer: Self-pay | Admitting: Psychology

## 2020-10-24 ENCOUNTER — Other Ambulatory Visit: Payer: Self-pay

## 2020-10-24 ENCOUNTER — Telehealth (INDEPENDENT_AMBULATORY_CARE_PROVIDER_SITE_OTHER): Payer: Federal, State, Local not specified - PPO | Admitting: Neurology

## 2020-10-24 ENCOUNTER — Encounter: Payer: Self-pay | Admitting: Neurology

## 2020-10-24 DIAGNOSIS — G44219 Episodic tension-type headache, not intractable: Secondary | ICD-10-CM | POA: Diagnosis not present

## 2020-10-24 DIAGNOSIS — R413 Other amnesia: Secondary | ICD-10-CM

## 2020-10-24 MED ORDER — GABAPENTIN 100 MG PO CAPS: ORAL_CAPSULE | ORAL | 3 refills | Status: DC

## 2020-10-24 NOTE — Patient Instructions (Signed)
Refills have been sent for gabapentin '100mg'$ :Take 2 caps every night  2. Schedule Neurocognitive evaluation  3. Follow-up in 1 year, call for any changes   RECOMMENDATIONS FOR ALL PATIENTS WITH MEMORY PROBLEMS: 1. Continue to exercise (Recommend 30 minutes of walking everyday, or 3 hours every week) 2. Increase social interactions - continue going to [place] and enjoy social gatherings with friends and family 3. Eat healthy, avoid fried foods and eat more fruits and vegetables 4. Maintain adequate blood pressure, blood sugar, and blood cholesterol level. Reducing the risk of stroke and cardiovascular disease also helps promoting better memory. 5. Avoid stressful situations. Live a simple life and avoid aggravations. Organize your time and prepare for the next day in anticipation. 6. Sleep well, avoid any interruptions of sleep and avoid any distractions in the bedroom that may interfere with adequate sleep quality 7. Avoid sugar, avoid sweets as there is a strong link between excessive sugar intake, diabetes, and cognitive impairment The Mediterranean diet has been shown to help patients reduce the risk of progressive memory disorders and reduces cardiovascular risk. This includes eating fish, eat fruits and green leafy vegetables, nuts like almonds and hazelnuts, walnuts, and also use olive oil. Avoid fast foods and fried foods as much as possible. Avoid sweets and sugar as sugar use has been linked to worsening of memory function.

## 2020-10-24 NOTE — Progress Notes (Signed)
Virtual visit text link sent twice. Patient called 3 times with no answer, went straight to voicemail. Will need to reschedule.

## 2020-10-24 NOTE — Progress Notes (Signed)
Telephone (Audio) Visit The purpose of this telephone visit is to provide medical care while limiting exposure to the novel coronavirus.    Consent was obtained for telephone visit:  Yes.   Answered questions that patient had about telehealth interaction:  Yes.   I discussed the limitations, risks, security and privacy concerns of performing an evaluation and management service by telephone. I also discussed with the patient that there may be a patient responsible charge related to this service. The patient expressed understanding and agreed to proceed.  Pt location: Home Physician Location: office Name of referring provider:  Lawerance Cruel, MD I connected with .Gregory Kline at patients initiation/request on 10/24/2020 at  8:30 AM EDT by telephone and verified that I am speaking with the correct person using two identifiers.  Pt MRN:  [place]:[id number] Pt DOB:  May 26, 1945   History of Present Illness:  The patient had a telephone visit on 10/24/2020. Unable to connect via video due to technical difficulties. He was last seen in the neurology clinic in February 2021 and was lost to follow-up. He was initially seen in July 2019 for difficulty concentrating and increasing headaches. MRI brain no acute changes, there was note of a chronic left inferior cerebellar infarct, right vetriculostomy catheter in place, ventricles small related to degree of atrophy. Neurosurgery at [facility] felt shunt is over 109 years old and may or may not be functional, and that there was no indication of shunt failure. EEG was mildly abnormal due to breach artifact over the right frontal region, no epileptiform discharges seen. He has been on Gabapentin for headache prophylaxis with good response, he reports a headache every couple of weeks on gabapentin '200mg'$  qhs. He had a bad headache one time and was given Acetaminophen-butalbital, which he only takes as needed, 1/2 tab. The full tab was too much. Family has expressed  concern about memory in the past, he is alone for today's visit. He reports that his youngest daughter has taken over his medications for a few months now, "I don't like it, there are times when there is some confusion." He lives with his wife. He states he is trying to accept the limitations imposed by his children with giving up his car, but states he still drives minimally and denies getting lost. Speech today is again slightly dysarthric, at times slow but answers appropriately but needing prompts and repeated questions at times. No recent falls.     History on Initial Assessment 10/25/2017: This is a pleasant 75 year old right-handed man with a history of left cerebellar stroke at age 22, hydrocephalus s/p VP shunt, renal cell carcinoma s/p left nephrectomy, hypertension, hyperlipidemia, presenting after hospital visit for altered mental status. His wife reports that over the past few months, he has had more trouble concentrating and difficulty staying on track. They deny any nonsensical speech. He dozes off to sleep frequently. He has also been having more headaches, she reports that he has had headaches for several years, usually getting better by spring time, but recently he has gone through a surge of clusters of headaches lasting 2-3 days at a time. Longest headache-free interval is 2-3 days. Headaches are usually worse in the Fall, but she states things have changed. He describes different kinds of pain on either temporal region and around his eyes lasting several hours. Acetaminophen+caffeine do not help. There is no associated nausea/vomiting. Sometimes he is sensitive to lights and sounds. He states it has affected his mood as  well, "I don't like me with it," depression is worse, he gets irritated more easily. At baseline, he has repetitive upward eye rolling movements, but she states that these have also increased in frequency since December 2018. Due to worsening symptoms, he went to the ER on  10/20/2017 where he had a head CT and MRI brain without contrast which I personally reviewed, no acute changes seen, there was chronic left inferior cerebellar infarct, right ventriculostomy catheter in place, ventricles are small relative to the degree of atrophy, suggesting the possibility of overshunting. Images were reviewed by neurosurgeon Dr. [name] who stated that this is likely a chronic finding and shunt has likely been like this for many years, recommending outpatient neurosurgery follow-up. There is note of a prior MRI brain done 05/2015, images unavailable for review, with right frontal approach ventricular shunt with ventricles appearing relatively decompressed relative to degree of cerebral volume loss.   His wife denies any staring episodes. Sometimes he does not respond but she feels this is due to his hearing loss. They recall being told in 2017 that there was a slight blockage in his VP shunt, but now were told there was "more than one place blocked." He denies any dizziness, diplopia, dysarthria/dysphagia, neck pain, focal numbness/tingling/weakness, bowel dysfunction. He has low back pain and urinary frequency. He recalls a fall this year during his exercise class.      Current Outpatient Medications on File Prior to Visit  Medication Sig Dispense Refill   acetaminophen (TYLENOL) 500 MG tablet Take 1,000 mg by mouth 2 (two) times daily.     amitriptyline (ELAVIL) 25 MG tablet Take 25 mg by mouth at bedtime.     ARIPiprazole (ABILIFY) 10 MG tablet Take 10 mg by mouth at bedtime.     aspirin EC 81 MG tablet Take 81 mg by mouth daily.     atorvastatin (LIPITOR) 20 MG tablet Take 40 mg by mouth daily.     dextran 70-hypromellose (TEARS RENEWED) ophthalmic solution Place 1 drop into both eyes at bedtime.      DULoxetine (CYMBALTA) 60 MG capsule Take 60 mg by mouth 2 (two) times daily.     finasteride (PROSCAR) 5 MG tablet Take 5 mg by mouth daily.     gabapentin (NEURONTIN) 100 MG  capsule Take 2 capsules in PM 60 capsule 0   levothyroxine (SYNTHROID, LEVOTHROID) 100 MCG tablet Take 100 mcg by mouth daily before breakfast.     lisinopril (ZESTRIL) 10 MG tablet Take 10 mg by mouth daily.     Miconazole Nitrate 2 % POWD Apply to affected area twice daily for 2 weeks 100 g 0   modafinil (PROVIGIL) 200 MG tablet Take by mouth.     Multiple Vitamin (MULTI-VITAMINS) TABS Take by mouth.     MYRBETRIQ 50 MG TB24 tablet      pantoprazole (PROTONIX) 40 MG tablet Take 40 mg by mouth daily.     risedronate (ACTONEL) 35 MG tablet Take 35 mg by mouth 3 (three) times a week. with water on empty stomach, nothing by mouth or lie down for next 30 minutes.takes on sunday     SUMAtriptan (IMITREX) 100 MG tablet Take 100 mg by mouth 2 (two) times daily as needed for migraine.     tamsulosin (FLOMAX) 0.4 MG CAPS capsule Take 0.8 mg by mouth.     terbinafine (LAMISIL) 250 MG tablet Take 1 tablet (250 mg total) by mouth daily. 10 tablet 0   Travoprost, BAK Free, (TRAVATAN)  0.004 % SOLN ophthalmic solution Place 1 drop into both eyes at bedtime.       UNABLE TO FIND Med Name: Acetaminophen-Butal-Caffeine 1-50-40 Per daughter report, 2 pills twice daily as needed     No current facility-administered medications on file prior to visit.     Observations/Objective:   Exam limited due to nature of phone visit. Patient is awake, alert, with slight dysarthria that was noted on initial visit in 2019, at times slow but answers appropriately but needing prompts and repeated questions at times.     Assessment and Plan:   This is a 74 yo LH man with a history of left cerebellar stroke at age 73, hydrocephalus s/p VP shunt, renal cell carcinoma s/p left nephrectomy, hypertension, hyperlipidemia, who presented after a hospital visit in July 2019 for altered mental status. He reported more difficulty concentrating and increasing headaches. MRI brain in the ER showed a chronic left inferior cerebellar  infarct, right ventriculostomy catheter in place, ventricles are small relative to the degree of atrophy, suggesting the possibility of overshunting. This was not felt to be the case by Neurosurgery evaluation at [facility]. He has had improvement of headaches with gabapentin '200mg'$  qhs, refills sent. He is alone for the visit but reports family concerns about complex tasks, memory issues have been raised by family in the past, we discussed Neurocognitive testing to further evaluate cognitive concerns. Follow-up in 1 year, call for any changes.    Follow Up Instructions:   -I discussed the assessment and treatment plan with the patient. The patient was provided an opportunity to ask questions and all were answered. The patient agreed with the plan and demonstrated an understanding of the instructions.   The patient was advised to call back or seek an in-person evaluation if the symptoms worsen or if the condition fails to improve as anticipated.    Total Time spent in visit with the patient was:  24 minutes, of which 100% of the time was spent in counseling and/or coordinating care on the above.   Pt understands and agrees with the plan of care outlined.     Cameron Sprang, MD

## 2020-11-07 ENCOUNTER — Other Ambulatory Visit: Payer: Self-pay

## 2020-11-07 ENCOUNTER — Ambulatory Visit: Payer: Federal, State, Local not specified - PPO | Admitting: Psychology

## 2020-11-07 ENCOUNTER — Encounter: Payer: Self-pay | Admitting: Psychology

## 2020-11-07 ENCOUNTER — Ambulatory Visit (INDEPENDENT_AMBULATORY_CARE_PROVIDER_SITE_OTHER): Payer: Federal, State, Local not specified - PPO | Admitting: Psychology

## 2020-11-07 DIAGNOSIS — K642 Third degree hemorrhoids: Secondary | ICD-10-CM | POA: Insufficient documentation

## 2020-11-07 DIAGNOSIS — E559 Vitamin D deficiency, unspecified: Secondary | ICD-10-CM | POA: Insufficient documentation

## 2020-11-07 DIAGNOSIS — M949 Disorder of cartilage, unspecified: Secondary | ICD-10-CM | POA: Insufficient documentation

## 2020-11-07 DIAGNOSIS — H903 Sensorineural hearing loss, bilateral: Secondary | ICD-10-CM | POA: Insufficient documentation

## 2020-11-07 DIAGNOSIS — K5730 Diverticulosis of large intestine without perforation or abscess without bleeding: Secondary | ICD-10-CM | POA: Insufficient documentation

## 2020-11-07 DIAGNOSIS — E739 Lactose intolerance, unspecified: Secondary | ICD-10-CM | POA: Insufficient documentation

## 2020-11-07 DIAGNOSIS — F411 Generalized anxiety disorder: Secondary | ICD-10-CM | POA: Insufficient documentation

## 2020-11-07 DIAGNOSIS — M25532 Pain in left wrist: Secondary | ICD-10-CM | POA: Insufficient documentation

## 2020-11-07 DIAGNOSIS — H401130 Primary open-angle glaucoma, bilateral, stage unspecified: Secondary | ICD-10-CM | POA: Insufficient documentation

## 2020-11-07 DIAGNOSIS — Z982 Presence of cerebrospinal fluid drainage device: Secondary | ICD-10-CM | POA: Insufficient documentation

## 2020-11-07 DIAGNOSIS — I639 Cerebral infarction, unspecified: Secondary | ICD-10-CM

## 2020-11-07 DIAGNOSIS — M25541 Pain in joints of right hand: Secondary | ICD-10-CM | POA: Insufficient documentation

## 2020-11-07 DIAGNOSIS — G919 Hydrocephalus, unspecified: Secondary | ICD-10-CM

## 2020-11-07 DIAGNOSIS — N401 Enlarged prostate with lower urinary tract symptoms: Secondary | ICD-10-CM | POA: Insufficient documentation

## 2020-11-07 DIAGNOSIS — G43719 Chronic migraine without aura, intractable, without status migrainosus: Secondary | ICD-10-CM | POA: Insufficient documentation

## 2020-11-07 DIAGNOSIS — D126 Benign neoplasm of colon, unspecified: Secondary | ICD-10-CM | POA: Insufficient documentation

## 2020-11-07 DIAGNOSIS — G8929 Other chronic pain: Secondary | ICD-10-CM | POA: Insufficient documentation

## 2020-11-07 DIAGNOSIS — M899 Disorder of bone, unspecified: Secondary | ICD-10-CM | POA: Insufficient documentation

## 2020-11-07 DIAGNOSIS — J309 Allergic rhinitis, unspecified: Secondary | ICD-10-CM | POA: Insufficient documentation

## 2020-11-07 DIAGNOSIS — E213 Hyperparathyroidism, unspecified: Secondary | ICD-10-CM | POA: Insufficient documentation

## 2020-11-07 DIAGNOSIS — F0150 Vascular dementia without behavioral disturbance: Secondary | ICD-10-CM | POA: Diagnosis not present

## 2020-11-07 DIAGNOSIS — F329 Major depressive disorder, single episode, unspecified: Secondary | ICD-10-CM | POA: Insufficient documentation

## 2020-11-07 DIAGNOSIS — R4189 Other symptoms and signs involving cognitive functions and awareness: Secondary | ICD-10-CM

## 2020-11-07 DIAGNOSIS — G4733 Obstructive sleep apnea (adult) (pediatric): Secondary | ICD-10-CM | POA: Insufficient documentation

## 2020-11-07 DIAGNOSIS — G2581 Restless legs syndrome: Secondary | ICD-10-CM | POA: Insufficient documentation

## 2020-11-07 DIAGNOSIS — Z9049 Acquired absence of other specified parts of digestive tract: Secondary | ICD-10-CM | POA: Insufficient documentation

## 2020-11-07 HISTORY — DX: Vascular dementia, unspecified severity, without behavioral disturbance, psychotic disturbance, mood disturbance, and anxiety: F01.50

## 2020-11-07 NOTE — Progress Notes (Signed)
   Psychometrician Note   Cognitive testing was administered to Gregory Kline by Gregory Kline, B.S. (psychometrist) under the supervision of Gregory Kline, Ph.D., licensed psychologist on 11/07/20. Gregory Kline did not appear overtly distressed by the testing session per behavioral observation or responses across self-report questionnaires. Rest breaks were offered.    The battery of tests administered was selected by Gregory Kline, Ph.D. with consideration to Gregory Kline current level of functioning, the nature of his symptoms, emotional and behavioral responses during interview, level of literacy, observed level of motivation/effort, and the nature of the referral question. This battery was communicated to the psychometrist. Communication between Gregory Kline, Ph.D. and the psychometrist was ongoing throughout the evaluation and Gregory Kline, Ph.D. was immediately accessible at all times. Gregory Kline, Ph.D. provided supervision to the psychometrist on the date of this service to the extent necessary to assure the quality of all services provided.    Gregory Kline will return within approximately 1-2 weeks for an interactive feedback session with Gregory Kline at which time his test performances, clinical impressions, and treatment recommendations will be reviewed in detail. Gregory Kline understands he can contact our office should he require our assistance before this time.  A total of 115 minutes of billable time were spent face-to-face with Gregory Kline by the psychometrist. This includes both test administration and scoring time. Billing for these services is reflected in the clinical report generated by Gregory Kline, Ph.D.  This note reflects time spent with the psychometrician and does not include test scores or any clinical interpretations made by Gregory Kline. The full report will follow in a separate note.

## 2020-11-07 NOTE — Progress Notes (Signed)
NEUROPSYCHOLOGICAL EVALUATION [place]. [place] Department of Neurology  Date of Evaluation: November 07, 2020  Reason for Referral:   Gregory Kline is a 75 y.o. 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   Assessment and Plan:   Clinical Impression(s): Gregory Kline pattern of performance is suggestive of primary impairments surrounding processing speed, cognitive flexibility, semantic fluency, and encoding (i.e., learning) aspects of verbal memory. Additional weaknesses relative to premorbid intellectual estimations were seen across basic attention. Performance was appropriate across phonemic fluency, confrontation naming, visuospatial abilities, and retrieval/consolidation aspects of memory. Complex attention/concentration and other aspects of executive functioning were unable to be assessed due to limited testing tolerance and Gregory Kline discontinuing the evaluation prematurely. Gregory Kline acknowledged difficulties performing instrumental activities of daily living (ADLs) independently, stating that his children have taken over financial and medication management responsibilities and have strongly encouraged him to abstain from driving. This, coupled with evidence for significant cognitive dysfunction described above, suggests that he meets criteria for a Major Neurocognitive Disorder ("dementia") at the present time.  Regarding etiology, the most likely cause for cognitive dysfunction appears to be his history of several strokes over the years (i.e., a vascular dementia presentation). Cerebrovascular disease in general commonly affects processing speed, attention/concentration, executive functioning, and learning aspects  of memory, which fits his pattern of dysfunction well. This pattern can certainly be seen within cerebellar strokes, as can ongoing balance instability and gait changes. In addition to his stroke history, resultant hydrocephalus would also create balance instability/gait changes and cognitive dysfunction in very similar areas. While he reported potential congenital hydrocephalus during interview, I do not have any medical records to confirm this. Given that he was able to complete school and earn a Bachelor's degree, it is far more likely that cognitive changes presented after his stroke and other medical ailments occurred. It is possible and somewhat likely that his dementia presentation is mixed due to a combination of his prior stroke history and ongoing hydrocephalic presentation. He did report acute symptoms of moderate anxiety and depression. While these symptoms may be exacerbating cognitive impairment, dysfunction is worse than what would be expected if it were due to these symptoms alone.   Specific to memory, Gregory Kline had significant difficulty learning verbally mediated information efficiently. This was despite using an amplification device to eliminate hearing loss as a confounding variable. However, what was able to be learned was not lost as delayed recall was among the stronger aspects of his cognitive profile. Overall, memory performance is not suggestive of Alzheimer's disease. Likewise, his cognitive and behavioral profile is not suggestive of Lewy body dementia or frontotemporal dementia at the present time. Continued medical monitoring will be important moving forward.   Recommendations: If Gregory Kline report surrounding his wife also having dementia is accurate, then I certainly agree with their reported plans to cease living independently and move in with one of their children in the near future.   Gregory Kline is encouraged to speak with his prescribing physician regarding medication  adjustments to optimally manage ongoing symptoms of anxiety and depression.  Medical records suggest a history of obstructive sleep apnea and that Gregory Kline has been noncompliant with his CPAP machine. If true, I would strongly advise that [name] utilize his CPAP machine on a  nightly basis. Untreated or poorly managed sleep apnea will worsen cognitive dysfunction, especially that related to prior cerebrovascular injury. It will also increase his risk for heart attack, as well as additional strokes.   Admittedly, performance across neurocognitive testing is not a strong predictor of an individual's safety operating a motor vehicle. However, given primary and significant deficits surrounding processing speed and executive functioning, I would recommend that Gregory Kline fully abstain from driving pursuits pending a formal driving evaluation. His family could contact [employer] in [place], [place] at [phone]. Another option would be through [facility]; however, the latter would likely require a referral from a medical doctor. [facility] can be reached directly at [phone].   Gregory Kline will likely benefit from the establishment and maintenance of a routine in order to maximize his functional abilities over time.  It will be important for him to have another person with him when in situations where he may need to process information, weigh the pros and cons of different options, and make decisions, in order to ensure that he fully understands and recalls all information to be considered.  If not already done, he and his family may want to discuss his wishes regarding durable power of attorney and medical decision making, so that he can have input into these choices. Additionally, they may wish to discuss future plans for caretaking and seek out community options for in home/residential care should they become necessary.  Gregory Kline is encouraged to attend to lifestyle  factors for brain health (e.g., regular physical exercise, good nutrition habits, regular participation in cognitively-stimulating activities, and general stress management techniques), which are likely to have benefits for both emotional adjustment and cognition. In fact, in addition to promoting good general health, regular exercise incorporating aerobic activities (e.g., brisk walking, jogging, cycling, etc.) has been demonstrated to be a very effective treatment for depression and stress, with similar efficacy rates to both antidepressant medication and psychotherapy.  When learning new information, he would benefit from information being broken up into small, manageable pieces. He may also find it helpful to articulate the material in his own words and in a context to promote encoding at the onset of a new task. This material may need to be repeated multiple times to promote encoding.  To address problems with processing speed, he may wish to consider:   -Ensuring that he is alerted when essential material or instructions are being presented   -Adjusting the speed at which new information is presented   -Allowing for more time in comprehending, processing, and responding in conversation  To address problems with fluctuating attention, he may wish to consider:   -Avoiding external distractions when needing to concentrate   -Limiting exposure to fast paced environments with multiple sensory demands   -Writing down complicated information and using checklists   -Attempting and completing one task at a time (i.e., no multi-tasking)   -Verbalizing aloud each step of a task to maintain focus   -Reducing the amount of information considered at one time  Review of Records:   Mr. Fayer was initially seen by [facility] KitchenEllouise Newer, M.D.) on 10/25/2017 after presenting to the hospital for altered mental status. At that time, his wife reported that over the past few months, Mr. Higa had more  trouble concentrating and staying on track. They denied nonsensical speech. They also reported an increase in headache frequencies. His wife noted that he has had headaches for several years, usually getting  better by spring time, but recently had gone through a surge of clusters of headaches lasting 2-3 days at a time. His longest headache-free interval was said to be 2-3 days. Symptoms included different kinds of pain on either temporal region and around his eyes lasting several hours. Acetaminophen + caffeine did not help. There was no associated nausea/vomiting but he occasionally experiences sensitivity to lights and sounds. At baseline, he has repetitive upward eye rolling movements. These were said to have increased in frequency since December 2018. Due to worsening symptoms, he went to the ER on 10/20/2017 where he had a head CT and brain MRI. No acute changes seen. There was a chronic left inferior cerebellar infarct (age 54) and a right ventriculostomy catheter was in place. His ventricles were said to be small relative to the degree of atrophy, suggesting the possibility of overshunting. They recalled being told in 2017 that there was a slight blockage in his VP shunt but were more recently told there was "more than one place blocked." His wife denied staring episodes. Sometimes he does not respond; however, this was attributed to hearing loss. He denied dizziness, diplopia, dysarthria/dysphagia, neck pain, focal numbness/tingling/weakness, and bowel dysfunction. He does have low back pain and urinary frequency. A referral for a neuropsychological evaluation was discussed; however, it does not seem that a prior evaluation was completed.   Mr. Marso was most recently seen by Dr. Delice Lesch via virtual visit on 10/24/2020. She noted that he had been last seen in February 2021 but was lost to follow-up. Dr. Delice Lesch noted that Neurosurgery at [facility] felt that his shunt may or may not be functional but that there  was no indication of shunt failure (it is 30+years old). Recent EEG was mildly abnormal due to breach artifact over the right frontal region. No epileptiform discharges were seen. He has been on Gabapentin for headache prophylaxis with good response. His family has expressed persisting concerns about memory and cognitive functioning. He reported that his youngest daughter has taken over his medications for a few months now. He stated he is trying to accept the limitations imposed by his children with giving up his car, but stated he still drives minimally and denied getting lost. Speech was said to be slightly dysarthric and slow at times. He denied any recent falls. Ultimately, Mr. Horath was referred for a comprehensive neuropsychological evaluation to characterize his cognitive abilities and to assist with diagnostic clarity and treatment planning.   Brain MRA on 06/05/2015 revealed encephalomalacia involving the left cerebellar hemisphere compatible with his prior infarct, as well as additional punctate perforator infarcts involving both cerebellar hemispheres. A small focus on encephalomalacia in the left parietal lobe, also likely representing an old infarct, was revealed. There was no evidence of hydrocephalus at that time. Additional neuroimaging from 2019 is described above. This scan also revealed advanced brain atrophy. I was unable to locate any more recent imaging studies.   Past Medical History:  Diagnosis Date   Allergic rhinitis    Benign prostatic hyperplasia with lower urinary tract symptoms    Bilateral inguinal hernia 08/07/2012   Chronic back pain    Chronic kidney disease, stage III (moderate) 02/17/2011   Chronic migraine without aura, intractable, without status migrainosus    CVA (cerebral infarction)    left inferior cerebellar stroke while in 85s   Disorder of bone and cartilage 09/26/2001   DEXA - 5/03 Femur Score  -2.4   Diverticulosis of colon    1995 abcess with  colovesical fistula - repaired Oct 10, 2001   Diverticulosis of intestine with bleeding 02/20/2011   history of GI bleed   Essential hypertension 02/17/2011   Gastroesophageal reflux disease 04/29/2015   EGD=nl 11.2012 + 5.2015 + 2017 including E+G+D Bx.   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   s/p resection at [facility] 2003; MRI 12/2002 - no evidene of recurrence   Obstructive sleep apnea 11/06/2012   not adherent w/CPAP   Pain in joints of right hand    Pain in left wrist    Primary open-angle glaucoma, bilateral, stage unspecified    Restless legs syndrome    S/P VP shunt    Sensorineural hearing loss (SNHL) of both ears    Third degree hemorrhoids    Tubular adenoma of colon 06/22/2000   3MM TUBULAR ADENOMA REMOVED FROM DESCENDING COLON 08/1998   Urinary calculus 01/20/2011   urinary bladder   Vitamin D deficiency     Past Surgical History:  Procedure Laterality Date   CERVICAL SPINE SURGERY     benign tumor   COLONOSCOPY  02/21/2011   Procedure: COLONOSCOPY;  Surgeon: Lafayette Dragon, MD;  Location: [facility];  Service: Endoscopy;  Laterality: N/A;   ESOPHAGOGASTRODUODENOSCOPY  02/19/2011   Procedure: ESOPHAGOGASTRODUODENOSCOPY (EGD);  Surgeon: Juanita Craver, MD;  Location: [facility];  Service: Endoscopy;  Laterality: N/A;   FLEXIBLE SIGMOIDOSCOPY  02/19/2011   Procedure: FLEXIBLE SIGMOIDOSCOPY;  Surgeon: Juanita Craver, MD;  Location: [facility];  Service: Endoscopy;  Laterality: N/A;    KIDNEY SURGERY     PARATHYROIDECTOMY     PARTIAL COLECTOMY     POLYPECTOMY  02/21/2011   Procedure: POLYPECTOMY;  Surgeon: Lafayette Dragon, MD;  Location: [facility];  Service: Endoscopy;  Laterality: Left;   THYROIDECTOMY, PARTIAL      Current Outpatient Medications:    acetaminophen (TYLENOL) 500 MG tablet, Take 1,000 mg by mouth 2 (two) times daily., Disp: , Rfl:    ACETAMINOPHEN-BUTALBITAL 50-325 MG TABS, Take by mouth., Disp: , Rfl:    amitriptyline (ELAVIL) 25 MG tablet, Take 25 mg by mouth at bedtime., Disp: , Rfl:    ARIPiprazole (ABILIFY) 10 MG tablet, Take 10 mg by mouth at bedtime., Disp: , Rfl:    aspirin EC 81 MG tablet, Take 81 mg by mouth daily., Disp: , Rfl:    atorvastatin (LIPITOR) 20 MG tablet, Take 40 mg by mouth daily., Disp: , Rfl:    dextran 70-hypromellose (TEARS RENEWED) ophthalmic solution, Place 1 drop into both eyes at bedtime. , Disp: , Rfl:    DULoxetine (CYMBALTA) 60 MG capsule, Take 60 mg by mouth 2 (two) times daily., Disp: , Rfl:    finasteride (PROSCAR) 5 MG tablet, Take 5 mg by mouth daily., Disp: , Rfl:    gabapentin (NEURONTIN) 100 MG capsule, Take 2 capsules every evening, Disp: 180 capsule, Rfl: 3   levothyroxine (SYNTHROID, LEVOTHROID) 100 MCG tablet, Take 100 mcg by mouth daily before breakfast., Disp: , Rfl:  lisinopril (ZESTRIL) 10 MG tablet, Take 10 mg by mouth daily., Disp: , Rfl:    Miconazole Nitrate 2 % POWD, Apply to affected area twice daily for 2 weeks, Disp: 100 g, Rfl: 0   modafinil (PROVIGIL) 200 MG tablet, Take by mouth., Disp: , Rfl:    Multiple Vitamin (MULTI-VITAMINS) TABS, Take by mouth., Disp: , Rfl:    MYRBETRIQ 50 MG TB24 tablet, , Disp: , Rfl:    pantoprazole (PROTONIX) 40 MG tablet, Take 40 mg by mouth daily., Disp: , Rfl:    risedronate (ACTONEL) 35 MG tablet, Take 35 mg by mouth 3 (three) times a week. with water on empty stomach, nothing by mouth or lie down for next 30 minutes.takes on sunday, Disp: , Rfl:    SUMAtriptan  (IMITREX) 100 MG tablet, Take 100 mg by mouth 2 (two) times daily as needed for migraine., Disp: , Rfl:    tamsulosin (FLOMAX) 0.4 MG CAPS capsule, Take 0.8 mg by mouth., Disp: , Rfl:    terbinafine (LAMISIL) 250 MG tablet, Take 1 tablet (250 mg total) by mouth daily., Disp: 10 tablet, Rfl: 0   Travoprost, BAK Free, (TRAVATAN) 0.004 % SOLN ophthalmic solution, Place 1 drop into both eyes at bedtime.  , Disp: , Rfl:    UNABLE TO FIND, Med Name: Acetaminophen-Butal-Caffeine 48-50-40 Per daughter report, 2 pills twice daily as needed, Disp: , Rfl:   Clinical Interview:   The following information was obtained during a clinical interview with Mr. Lionetti prior to cognitive testing. Unfortunately, Mr. Bolger attended the interview portion of the current evaluation alone. He appeared to be a poor historian. He was quite tangential, often providing responses which were wholly unrelated to questions asked. The following represents information which was able to be gathered.   Cognitive Symptoms: When asked about short-term memory dysfunction, Mr. Rennaker reported feeling "not as sharp." When asked more directly, he indicated that his wife and family had pronounced concerns, but stated that "with recent things, no." He was unable to comprehend questions surrounding changes in sustained attention and concentration. Responses to these questions included statements such as "I'm tired all the time" and that he occasionally "resent[s] my wife's input." When asked about increased distractibility, he responded with statements surrounding him having "a sheltered existence." He denied recent personality changes but did report increased instances where may yell due to worsening frustration. He further noted that his speech has become slower and he has some trouble with word finding.   When asked for the timeline of cognitive dysfunction, he appeared quite unclear. He reported that he has had "hydrocephalus since birth." I  do not see any mention in his medical records which could confirm this statement outside of prior shunting around the time of his stroke. There was a prior brain MRA from 2017 which specifically stated no presence of hydrocephalus. When asked if he noticed cognitive changes following his stroke in his 57s, he was unable to provide an answer.   Difficulties completing ADLs: Endorsed. He noted that he and his wife were planning on moving in with one of their daughters in October. He reported that his children have taken over all medication management, financial management, and bill paying responsibilities. He acknowledged that they have strong opinions that he should no longer drive due to cognitive concerns. However, he was resistant to this and reported still driving locally.  Additional Medical History: When asked about his prior medical history, he was able to state "there was a stroke." However,  he was unable to provide any details surrounding when it occurred, its severity, or any persisting physical or cognitive deficits. He highlighted his history of hydrocephalus and attributed some ongoing balance instability and slowed gait to this condition. He also attributed symptoms of "slowness and incomplete thinking" to this condition. When asked about his extensive headache history, he responded with "I didn't realize I had the medication" and "one time I had a full blown medication." He was unable to provide any details surrounding the frequency and severity of current headache symptoms or if said medication was helpful. Medical records suggest symptoms of chronic pain involving his lower back, hands, and wrists. As mentioned above, he acknowledged that his balance has deteriorated and that he is often unsteady on his feet. He was unsure if one side of the body seemed less stable than the other and denied any recent falls. Tremors or other movement abnormalities were denied. He acknowledged vision loss and a  history of glaucoma. Corrective lenses were said to be helpful. He also reported a history of hearing loss and that he has been fitted for hearing aids. He commented that "I brought them but didn't bother putting them in."  Sleep History: He noted that he was doing "a lot of sleeping" but was unable to provide a numerical estimation for a nightly average. He was unsure if he experiences trouble falling asleep or remaining asleep throughout the night. He did report commonly waking feeling tired and experiences fatigue throughout the day. He denied excessive movement while asleep. However, medical records suggest a history of restless leg syndrome. Medical records also suggest a history of obstructive sleep apnea and that he is noncompliant with his CPAP machine.  Psychiatric/Behavioral Health History: He reported a longstanding history of generally mild depressive symptoms dating back to "being 75 years old." He also reported "multifaceted" symptoms of anxiety, also longstanding in nature. He was unsure if current mood-related medication intervention was helpful at managing these symptoms. He denied ongoing hallucinations, paranoia, or a history of delusional thinking. Current or remote suicidal ideation, intent, or plan was also denied.   He reported stopping tobacco consumption in the distant past. He noted rare alcohol consumption and denied a history of problematic alcohol abuse or dependence. Substance abuse concerns, both past and present, were also denied.   Family History: Problem Relation Age of Onset   Cancer Mother    Leukemia Father    This information was confirmed by Mr. Ligocki.  Academic/Vocational History: He graduated from high school and went on to earn a Dietitian in Industrial/product designer. He described himself as an average (C) student in academic settings. He did not report any history of grade repetition, enrollment in special education courses, or concerns surrounding a  developmental delay or LD/ADHD condition.   He retired in 2009. 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 Holdt was accompanied by his wife in the lobby but did not invite her back for portions of the evaluation. He stated that this was because "she has dementia and thought that this appointment was for her." He arrived to his appointment on time and was appropriately dressed and groomed. He generally appeared alert and oriented. He ambulated slowly and with slight instability. One side was not observed to be worse than the other; however, his shoulder did appear to dip on the right side relative to the left  and there was minimal arm swimming motion. Gross motor functioning appeared intact upon informal observation and no abnormal movements (e.g., tremors) were noted. His affect was generally relaxed and positive, but did range appropriately given the subject being discussed during the clinical interview or the task at hand during testing procedures. Spontaneous speech was fluent and word finding difficulties were not observed during interview. Thought processes were quite tangential and he would often provide responses to questions which were wholly unrelated to the original question asked (see above). Some of this may be due to hearing loss as this was apparent during interview and he was not wearing hearing aids. Insight into his cognitive difficulties appeared limited.   An amplification device (i.e., Pocket Talker) was utilized during testing to limit hearing loss as a confounding variable. His phone went off several times during the evaluation and he appeared to have no knowledge of how to operate this device. At one point, he requested that the psychometrist stop the ringing for him. He was noted to become upset when the psychometrist would provide  assistance or instruction-mandated redirection during testing. While coughing was not present during interview, it was significant during testing to the extent that Mr. Becherer had brought a bottle of cough syrup with him. He was visibly frustrated when repeated attempts to remove his facemask were stopped by the psychometrist. A little past the midpoint of the evaluation, frustration with the act of testing was apparent. When asked if he had the energy to continue, he stated "nope" and ultimately left the testing room. Curiously, he later returned and did not seem to remember that he discontinued the evaluation prematurely and that the psychometrist had informed him that testing was over and wished him a safe drive home. Sustained attention was generally adequate. Task engagement was adequate. Despite the brief nature of mood-related questionnaires, it took him a very long time to complete these measures. Overall, Mr. Chatel was generally cooperative with the clinical interview and subsequent testing procedures.   Adequacy of Effort: The validity of neuropsychological testing is limited by the extent to which the individual being tested may be assumed to have exerted adequate effort during testing. Mr. Menton expressed his intention to perform to the best of his abilities and exhibited adequate task engagement and persistence. Scores across stand-alone and embedded performance validity measures were variable. However, his below expectation performance is likely due to true dysfunction surrounding processing speed rather than attempts to perform poorly. As such, I do believe that the results of the current evaluation are a valid representation of Mr. Butters current cognitive functioning.  Test Results: Mr. Glasson was generally oriented at the time of the current evaluation. He did incorrectly state his age ("1").   Intellectual abilities based upon educational and vocational attainment were estimated to be  in the average range. Premorbid abilities were estimated to be within the above average range based upon a single-word reading test.   Processing speed was exceptionally low to well below average. Basic attention was below average. More complex attention (e.g., working memory) was unable to be assessed due to limited testing tolerance. Cognitive flexibility was exceptionally low to well below average. Additional aspects of executive functioning was unable to be assessed due to limited testing tolerance.  Receptive language was unable to be assessed and the degree of impairment is unknown. It is likely that ongoing hearing loss directly impacts this domain to an undetermined extent. While using an amplification device, he was able to understand task  instructions adequately. However, without this device, he had notable trouble comprehending questions and providing relevant and appropriate answers during interview. Assessed expressive language was variable. Phonemic fluency was below average, semantic fluency was exceptionally low to well below average, and confrontation naming was average.   Assessed visuospatial/visuoconstructional abilities were above average.    Learning (i.e., encoding) of novel verbal information was exceptionally low to well below average. Spontaneous delayed recall (i.e., retrieval) of previously learned information was variable, ranging from the well below average to average normative ranges. Retention rates were 86% across a story learning task, 17% across a list learning task, and 74% across a figure drawing task. Performance across recognition tasks was appropriate, suggesting evidence for information consolidation.   Results of emotional screening instruments suggested that recent symptoms of generalized anxiety were in the moderate range, while symptoms of depression were also within the moderate range. A screening instrument assessing recent sleep quality suggested the presence  of minimal sleep dysfunction.  Tables of Scores:   Note: This summary of test scores accompanies the interpretive report and should not be considered in isolation without reference to the appropriate sections in the text. Descriptors are based on appropriate normative data and may be adjusted based on clinical judgment. Terms such as "Within Normal Limits" and "Outside Normal Limits" are used when a more specific description of the test score cannot be determined.       Percentile - Normative Descriptor > 98 - Exceptionally High 91-97 - Well Above Average 75-90 - Above Average 25-74 - Average 9-24 - Below Average 2-8 - Well Below Average < 2 - Exceptionally Low       Validity:   DESCRIPTOR       Dot Counting Test: --- --- Outside Normal Limits  RBANS Effort Index: --- --- Within Normal Limits       Orientation:      Raw Score Percentile   NAB Orientation, Form 1 28/29 --- ---       Cognitive Screening:      Raw Score Percentile   SLUMS: 23/30 --- ---       RBANS, Form A: Standard Score/ Scaled Score Percentile   Total Score 80 9 Below Average  Immediate Memory 65 1 Exceptionally Low    List Learning 3 1 Exceptionally Low    Story Memory 5 5 Well Below Average  Visuospatial/Constructional 116 86 Above Average    Figure Copy 12 75 Above Average    Line Orientation 19/20 >75 Above Average  Language 82 12 Below Average    Picture Naming 9/10 26-50 Average    Semantic Fluency 3 1 Exceptionally Low  Attention 64 1 Exceptionally Low    Digit Span 7 16 Below Average    Coding 2 <1 Exceptionally Low  Delayed Memory 98 45 Average    List Recall 1/10 3-9 Below Average    List Recognition 20/20 51-75 Average    Story Recall 7 16 Below Average    Story Recognition 11/12 47-68 Average    Figure Recall 11 63 Average    Figure Recognition 8/8 70+ Average       Intellectual Functioning:      Standard Score Percentile   Test of Premorbid Functioning: 118 88 Above Average        Attention/Executive Function:     Trail Making Test (TMT): Raw Score (T Score) Percentile     Part A 59 secs.,  0 errors (36) 8 Well Below Average    Part  B 250 secs.,  1 error (31) 3 Well Below Average        D-KEFS Verbal Fluency Test: Raw Score (Scaled Score) Percentile     Letter Total Correct 33 (9) 37 Average    Category Total Correct 23 (6) 9 Below Average    Category Switching Total Correct 6 (3) 1 Exceptionally Low    Category Switching Accuracy 5 (4) 2 Well Below Average      Total Set Loss Errors 1 (11) 63 Average      Total Repetition Errors 3 (10) 50 Average       Language:     Verbal Fluency Test: Raw Score (T Score) Percentile     Phonemic Fluency (FAS) 33 (42) 21 Below Average    Animal Fluency 12 (36) 8 Well Below Average        Visuospatial/Visuoconstruction:      Raw Score Percentile   Clock Drawing: 10/10 --- Within Normal Limits       Mood and Personality:      Raw Score Percentile   PROMIS Depression Questionnaire: 31 --- Moderate  PROMIS Anxiety Questionnaire: 27 --- Moderate       Additional Questionnaires:      Raw Score Percentile   PROMIS Sleep Disturbance Questionnaire: 18 --- None to Slight   Informed Consent and Coding/Compliance:   The current evaluation represents a clinical evaluation for the purposes previously outlined by the referral source and is in no way reflective of a forensic evaluation.   Mr. Lacki was provided with a verbal description of the nature and purpose of the present neuropsychological evaluation. Also reviewed were the foreseeable risks and/or discomforts and benefits of the procedure, limits of confidentiality, and mandatory reporting requirements of this provider. The patient was given the opportunity to ask questions and receive answers about the evaluation. Oral consent to participate was provided by the patient.   This evaluation was conducted by Christia Reading, Ph.D., licensed clinical neuropsychologist. Mr.  Barkus completed a clinical interview with Dr. Melvyn Novas, billed as one unit [phone], and 115 minutes of cognitive testing and scoring, billed as one unit [phone] and three additional units [id number]. Psychometrist Milana Kidney, B.S., assisted Dr. Melvyn Novas with test administration and scoring procedures. As a separate and discrete service, Dr. Melvyn Novas spent a total of 160 minutes in interpretation and report writing billed as one unit [phone] and two units 96133.

## 2020-11-17 ENCOUNTER — Encounter: Payer: Federal, State, Local not specified - PPO | Admitting: Psychology

## 2020-12-10 ENCOUNTER — Ambulatory Visit: Payer: Federal, State, Local not specified - PPO | Admitting: Physician Assistant

## 2020-12-15 ENCOUNTER — Emergency Department (HOSPITAL_COMMUNITY): Payer: No Typology Code available for payment source

## 2020-12-15 ENCOUNTER — Emergency Department (HOSPITAL_COMMUNITY)
Admission: EM | Admit: 2020-12-15 | Discharge: 2020-12-15 | Disposition: A | Discharge status: Home / Self Care | Payer: No Typology Code available for payment source | Attending: Emergency Medicine | Admitting: Emergency Medicine

## 2020-12-15 ENCOUNTER — Other Ambulatory Visit: Payer: Self-pay

## 2020-12-15 ENCOUNTER — Encounter (HOSPITAL_COMMUNITY): Payer: Self-pay

## 2020-12-15 DIAGNOSIS — I129 Hypertensive chronic kidney disease with stage 1 through stage 4 chronic kidney disease, or unspecified chronic kidney disease: Secondary | ICD-10-CM | POA: Diagnosis not present

## 2020-12-15 DIAGNOSIS — R799 Abnormal finding of blood chemistry, unspecified: Secondary | ICD-10-CM | POA: Diagnosis present

## 2020-12-15 DIAGNOSIS — Z87891 Personal history of nicotine dependence: Secondary | ICD-10-CM | POA: Insufficient documentation

## 2020-12-15 DIAGNOSIS — Z7982 Long term (current) use of aspirin: Secondary | ICD-10-CM | POA: Diagnosis not present

## 2020-12-15 DIAGNOSIS — Z85528 Personal history of other malignant neoplasm of kidney: Secondary | ICD-10-CM | POA: Diagnosis not present

## 2020-12-15 DIAGNOSIS — E860 Dehydration: Secondary | ICD-10-CM | POA: Diagnosis not present

## 2020-12-15 DIAGNOSIS — F0390 Unspecified dementia without behavioral disturbance: Secondary | ICD-10-CM | POA: Insufficient documentation

## 2020-12-15 DIAGNOSIS — Z79899 Other long term (current) drug therapy: Secondary | ICD-10-CM | POA: Insufficient documentation

## 2020-12-15 DIAGNOSIS — N1832 Chronic kidney disease, stage 3b: Secondary | ICD-10-CM | POA: Diagnosis not present

## 2020-12-15 DIAGNOSIS — Z91040 Latex allergy status: Secondary | ICD-10-CM | POA: Diagnosis not present

## 2020-12-15 LAB — CBC WITH DIFFERENTIAL/PLATELET
Abs Immature Granulocytes: 0.05 10*3/uL (ref 0.00–0.07)
Basophils Absolute: 0 10*3/uL (ref 0.0–0.1)
Basophils Relative: 0 %
Eosinophils Absolute: 0.1 10*3/uL (ref 0.0–0.5)
Eosinophils Relative: 2 %
HCT: 46.3 % (ref 39.0–52.0)
Hemoglobin: 15 g/dL (ref 13.0–17.0)
Immature Granulocytes: 1 %
Lymphocytes Relative: 9 %
Lymphs Abs: 0.7 10*3/uL (ref 0.7–4.0)
MCH: 29.9 pg (ref 26.0–34.0)
MCHC: 32.4 g/dL (ref 30.0–36.0)
MCV: 92.4 fL (ref 80.0–100.0)
Monocytes Absolute: 0.5 10*3/uL (ref 0.1–1.0)
Monocytes Relative: 7 %
Neutro Abs: 6.1 10*3/uL (ref 1.7–7.7)
Neutrophils Relative %: 81 %
Platelets: 178 10*3/uL (ref 150–400)
RBC: 5.01 MIL/uL (ref 4.22–5.81)
RDW: 14 % (ref 11.5–15.5)
WBC: 7.5 10*3/uL (ref 4.0–10.5)
nRBC: 0 % (ref 0.0–0.2)

## 2020-12-15 LAB — URINALYSIS, ROUTINE W REFLEX MICROSCOPIC
Bilirubin Urine: NEGATIVE
Glucose, UA: NEGATIVE mg/dL
Hgb urine dipstick: NEGATIVE
Ketones, ur: NEGATIVE mg/dL
Leukocytes,Ua: NEGATIVE
Nitrite: NEGATIVE
Protein, ur: NEGATIVE mg/dL
Specific Gravity, Urine: 1.015 (ref 1.005–1.030)
pH: 6 (ref 5.0–8.0)

## 2020-12-15 LAB — COMPREHENSIVE METABOLIC PANEL
ALT: 13 U/L (ref 0–44)
AST: 13 U/L — ABNORMAL LOW (ref 15–41)
Albumin: 4.5 g/dL (ref 3.5–5.0)
Alkaline Phosphatase: 79 U/L (ref 38–126)
Anion gap: 8 (ref 5–15)
BUN: 37 mg/dL — ABNORMAL HIGH (ref 8–23)
CO2: 24 mmol/L (ref 22–32)
Calcium: 10.2 mg/dL (ref 8.9–10.3)
Chloride: 111 mmol/L (ref 98–111)
Creatinine, Ser: 2.04 mg/dL — ABNORMAL HIGH (ref 0.61–1.24)
GFR, Estimated: 33 mL/min — ABNORMAL LOW (ref >60)
Glucose, Bld: 113 mg/dL — ABNORMAL HIGH (ref 70–99)
Potassium: 4.5 mmol/L (ref 3.5–5.1)
Sodium: 143 mmol/L (ref 135–145)
Total Bilirubin: 0.8 mg/dL (ref 0.3–1.2)
Total Protein: 7.5 g/dL (ref 6.5–8.1)

## 2020-12-15 LAB — SODIUM, URINE, RANDOM: Sodium, Ur: 107 mmol/L

## 2020-12-15 LAB — CREATININE, URINE, RANDOM: Creatinine, Urine: 104.06 mg/dL

## 2020-12-15 MED ORDER — SODIUM CHLORIDE 0.9 % IV BOLUS
1000.0000 mL | Freq: Once | INTRAVENOUS | Status: AC
  Administered 2020-12-15: 1000 mL via INTRAVENOUS

## 2020-12-15 NOTE — ED Provider Notes (Signed)
[facility] DEPT Provider Note   CSN: [id number] Arrival date & time: 12/15/20  1053     History Chief Complaint  Patient presents with   Abnormal Lab    Gregory Kline is a 75 y.o. male.  HPI    75 year old male comes in with chief complaint of abnormal labs.  Patient has history of strokes, hypertension, and horseshoe kidney, BPH.  He is here with his daughter.  Daughter reports that patient was seen at the [place] recently for routine work-up and was found to have creatinine of over 3.  They were advised to come to the ER immediately.  Patient denies any nausea, vomiting, diarrhea, new confusion.  He does indicate reduced water intake in general.  Patient has history of BPH, but denies any urinary retention or any planned intervention at this time.  Past Medical History:  Diagnosis Date   Allergic rhinitis    Benign prostatic hyperplasia with lower urinary tract symptoms    Bilateral inguinal hernia 08/07/2012   Chronic back pain    Chronic kidney disease, stage III (moderate) 02/17/2011   Chronic migraine without aura, intractable, without status migrainosus    CVA (cerebral infarction)    left inferior cerebellar stroke while in 24s   Disorder of bone and cartilage 09/26/2001   DEXA - 5/03 Femur Score  -2.4   Diverticulosis of colon    1995 abcess with colovesical fistula - repaired Oct 10, 2001   Diverticulosis of intestine with bleeding 02/20/2011   history of GI bleed   Essential hypertension 02/17/2011   Gastroesophageal reflux disease 04/29/2015   EGD=nl 11.2012 + 5.2015 + 2017 including E+G+D Bx.   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   s/p resection at [facility] 2003; MRI 12/2002 - no evidene of recurrence   Obstructive sleep apnea 11/06/2012   not adherent w/CPAP   Pain in joints of right hand    Pain in left wrist    Primary open-angle glaucoma, bilateral, stage unspecified    Restless legs syndrome    S/P VP shunt    Sensorineural hearing loss (SNHL) of both ears    Third degree hemorrhoids    Tubular adenoma of colon 06/22/2000   3MM TUBULAR ADENOMA REMOVED FROM DESCENDING COLON 08/1998   Urinary calculus 01/20/2011   urinary bladder   Vascular dementia 11/07/2020   Vitamin D deficiency     Patient Active Problem List   Diagnosis Date Noted   Vascular dementia 11/07/2020   Benign prostatic hyperplasia with lower urinary tract symptoms    Lactose intolerance    Obstructive sleep apnea    Restless legs syndrome    Sensorineural hearing loss (SNHL) of both ears    Allergic rhinitis    Tubular adenoma of colon    Chronic migraine without aura, intractable, without status migrainosus    Major depressive disorder    Diverticulosis of colon    Generalized anxiety disorder    History of cholecystectomy  S/P VP shunt    Hyperparathyroidism    Chronic back pain    Pain in joints of right hand    Pain in left wrist    Primary open-angle glaucoma, bilateral, stage unspecified    Disorder of bone and cartilage    Third degree hemorrhoids    Vitamin D deficiency    Iron deficiency 08/05/2014   Bilateral inguinal hernia 08/07/2012   Hypophosphatemia 02/21/2011   Diverticulosis of intestine with bleeding 02/20/2011   Leukocytosis 02/19/2011   Chronic kidney disease, stage III (moderate) ([place]) 02/17/2011   Essential hypertension 02/17/2011   CVA (cerebral infarction)    Gastroesophageal reflux disease 03/29/2009    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   Hydrocephalus 03/30/1975    Past Surgical History:  Procedure Laterality Date   CERVICAL SPINE SURGERY     benign tumor   COLONOSCOPY  02/21/2011   Procedure: COLONOSCOPY;  Surgeon: Lafayette Dragon, MD;  Location: [facility];  Service: Endoscopy;  Laterality: N/A;   ESOPHAGOGASTRODUODENOSCOPY  02/19/2011   Procedure: ESOPHAGOGASTRODUODENOSCOPY (EGD);  Surgeon: Juanita Craver, MD;  Location: [facility];  Service: Endoscopy;  Laterality: N/A;   FLEXIBLE SIGMOIDOSCOPY  02/19/2011   Procedure: FLEXIBLE SIGMOIDOSCOPY;  Surgeon: Juanita Craver, MD;  Location: [facility];  Service: Endoscopy;  Laterality: N/A;   KIDNEY SURGERY     PARATHYROIDECTOMY     PARTIAL COLECTOMY     POLYPECTOMY  02/21/2011   Procedure: POLYPECTOMY;  Surgeon: Lafayette Dragon, MD;  Location: [facility];  Service: Endoscopy;  Laterality: Left;   THYROIDECTOMY, PARTIAL         Family History  Problem Relation Age of Onset   Cancer Mother    Leukemia Father     Social History   Tobacco Use   Smoking status: Former   Smokeless tobacco: Never  Scientific laboratory technician Use: Never used  Substance Use Topics   Alcohol use: Yes    Alcohol/week: 1.0 standard drink    Types: 1 drink(s) per week    Comment: occassional   Drug use: No    Comment: quit in teens (smoked approx. 6 months)    Home Medications Prior to Admission medications   Medication Sig Start Date End Date Taking? Authorizing Provider  acetaminophen (TYLENOL) 500 MG tablet Take 1,000 mg by mouth 2 (two) times daily.    [provider]  ACETAMINOPHEN-BUTALBITAL 50-325 MG TABS Take by mouth.    [provider]  amitriptyline (ELAVIL) 25 MG tablet Take 25 mg by mouth at bedtime.    [provider]  ARIPiprazole (ABILIFY) 10 MG tablet Take 10 mg by mouth at bedtime.    [provider]  aspirin EC 81 MG tablet Take 81 mg by mouth daily.    [provider]  atorvastatin (LIPITOR) 20 MG tablet Take 40 mg by mouth daily.    [provider]  dextran 70-hypromellose (TEARS RENEWED) ophthalmic solution Place 1 drop into both eyes at bedtime.     [provider]  DULoxetine (CYMBALTA) 60 MG capsule Take 60 mg by mouth 2 (two) times daily.    [provider]  finasteride (PROSCAR) 5 MG tablet Take 5 mg by mouth daily.    [provider]  gabapentin (NEURONTIN) 100 MG capsule Take 2 capsules every evening 10/24/20   Cameron Sprang, MD  levothyroxine (SYNTHROID, LEVOTHROID) 100 MCG tablet Take 100 mcg by mouth daily before breakfast.    [provider]  lisinopril (ZESTRIL) 10 MG tablet Take 10 mg by mouth daily.    [provider]  Miconazole Nitrate 2 % POWD Apply to affected area twice daily for 2 weeks 08/29/20   Lamptey, Myrene Galas, MD  modafinil (PROVIGIL) 200 MG tablet Take by mouth.    [provider]  Multiple Vitamin (MULTI-VITAMINS) TABS Take by mouth.    [provider]  MYRBETRIQ 50 MG TB24 tablet  08/17/17   [provider]  pantoprazole (PROTONIX) 40 MG tablet Take 40 mg by mouth daily.    [provider]  risedronate (ACTONEL) 35 MG tablet Take 35 mg by mouth 3 (three) times a week. with water on empty stomach, nothing by mouth or lie down for next 30 minutes.takes on sunday    [provider]  SUMAtriptan (IMITREX) 100 MG tablet Take 100 mg by mouth 2 (two) times daily as needed for migraine.    [provider]  tamsulosin (FLOMAX) 0.4 MG CAPS capsule Take 0.8 mg by mouth.    [provider]  terbinafine (LAMISIL) 250 MG tablet Take 1 tablet (250 mg total) by mouth daily. 08/29/20   Lamptey, Myrene Galas, MD  Travoprost, BAK Free, (TRAVATAN) 0.004 % SOLN ophthalmic solution Place 1 drop into both eyes at bedtime.      [provider]  UNABLE TO FIND Med Name: Acetaminophen-Butal-Caffeine [phone] Per daughter  report, 2 pills twice daily as needed    [provider]    Allergies    Cafergot, Celebrex [celecoxib], Lactose, Latex, and Sulfa antibiotics  Review of Systems   Review of Systems  Constitutional:  Negative for activity change.  Respiratory:  Negative for shortness of breath.   Cardiovascular:  Negative for chest pain.  Gastrointestinal:  Negative for nausea and vomiting.  Neurological:  Negative for dizziness.  All other systems reviewed and are negative.  Physical Exam Updated Vital Signs BP (!) 141/92   Pulse 89   Temp 97.8 F (36.6 C) (Oral)   Resp 16   Ht '5\' 9"'$  (1.753 m)   Wt 87.1 kg   SpO2 98%   BMI 28.35 kg/m   Physical Exam Vitals and nursing note reviewed.  Constitutional:      Appearance: He is well-developed.  HENT:     Head: Atraumatic.  Cardiovascular:     Rate and Rhythm: Normal rate.  Pulmonary:     Effort: Pulmonary effort is normal.  Abdominal:     General: There is distension.     Tenderness: There is no guarding or rebound.  Musculoskeletal:     Cervical back: Neck supple.  Skin:    General: Skin is warm.  Neurological:     Mental Status: He is alert and oriented to person, place, and time.    ED Results / Procedures / Treatments   Labs (all labs ordered are listed, but only abnormal results are displayed) Labs Reviewed  COMPREHENSIVE METABOLIC PANEL - Abnormal; Notable for the following components:      Result Value   Glucose, Bld 113 (*)    BUN 37 (*)    Creatinine, Ser 2.04 (*)    AST 13 (*)    GFR, Estimated 33 (*)    All other components within normal limits  CBC WITH DIFFERENTIAL/PLATELET  SODIUM, URINE, RANDOM  CREATININE, URINE, RANDOM  URINALYSIS, ROUTINE W REFLEX MICROSCOPIC    EKG None  Radiology US Renal  Result Date: 12/15/2020 CLINICAL DATA:  New renal failure, history of left  nephrectomy EXAM: RENAL / URINARY TRACT ULTRASOUND COMPLETE COMPARISON:  MRI abdomen 11/23/2018 FINDINGS: Right Kidney: Renal  measurements: 13.6 cm x 6.8 cm x 6.4 cm = volume: 306 mL. A lobulated hypoechoic upper pole lesion is seen measuring 7.3 cm x 7.4 cm x 6.6 cm corresponding to the cyst seen on the prior MRI. There is a thin internal septation. There are no solid components. There is no internal vascularity. The additional smaller cysts seen on the prior MRI are not identified on the current study. There is no hydronephrosis. There are no shadowing stones. Left Kidney: Surgically absent. There is no abnormal soft tissue mass in the left renal fossa. Bladder: The bladder wall is thickened measuring up to 5 mm. Other: The prostate is enlarged with a calculated volume of 56 cc. IMPRESSION: 1. Lobulated right upper pole renal cyst corresponding to the lesion seen on the prior MR abdomen of 11/23/2018. The additional smaller cysts seen on that study are not identified. 2. No hydronephrosis. 3. Enlarged prostate with bladder wall thickening which may reflect cystitis or chronic outlet obstruction. 4. Surgically absent left kidney. Electronically Signed   By: Valetta Mole M.D.   On: 12/15/2020 13:35    Procedures Procedures   Medications Ordered in ED Medications  sodium chloride 0.9 % bolus 1,000 mL (1,000 mLs Intravenous New Bag/Given 12/15/20 1242)    ED Course  I have reviewed the triage vital signs and the nursing notes.  Pertinent labs & imaging results that were available during my care of the patient were reviewed by me and considered in my medical decision making (see chart for details).  Clinical Course as of 12/15/20 1442  Mon Dec 15, 2020  1406 Creatinine(!): 2.04 Creatinine is actually much closer to what was expected to his baseline normal.  Perhaps there was a lab error at the [place].  Ultrasound does not show any hydronephrosis.  Anticipate discharge. [AN]    Clinical Course User Index [AN] Varney Biles, MD   MDM Rules/Calculators/A&P                            75 year old male sent to the ER with  chief complaint of abnormal labs.  He has history of CKD and only 1 kidney.  We will initiate basic labs and get ultrasound kidney.  It appears that patient does not hydrate well.  No additional insensible fluid loss or new medications per family.  Does not appear that he is having urinary retention.  Final Clinical Impression(s) / ED Diagnoses Final diagnoses:  Dehydration  Stage 3b chronic kidney disease ([place])    Rx / DC Orders ED Discharge Orders     None        Varney Biles, MD 12/15/20 1442

## 2020-12-15 NOTE — Discharge Instructions (Addendum)
You are seen in the ER with concerns of worsening renal function.  However, our lab indicates that your creatinine is close to baseline normal.  It is unclear if there was an error on the side of the lab at the [place].  At this time, we recommend that you follow-up with your PCP again in 1 week for repeat blood work.  Until then, ensure that you are hydrating well.

## 2020-12-15 NOTE — ED Triage Notes (Addendum)
Patient states he was notified that he had abnormal labs that indicated he had renal failure. Patient states he had blood drawn at the [place] last week.  Patient states he is currently living at [place] [facility].  Patient's daughter reports that the BUN-44 and creatine 3.8

## 2020-12-19 ENCOUNTER — Telehealth: Payer: Self-pay | Admitting: Neurology

## 2020-12-19 NOTE — Telephone Encounter (Signed)
Pt called back and it was explained to him. Let him know they are working on his script.

## 2020-12-19 NOTE — Telephone Encounter (Signed)
Pt called no answer when he call back ill let him know that the pharmacy is working on fixing his script. They are calling his insurance because they have he had it filled aug 12th and he did not so they are fixing it so he can get his medication ,

## 2020-12-19 NOTE — Telephone Encounter (Signed)
Patient and patient Daughter called together and states the Gabapentin medication needs to be refilled, when they call [facility] to get it refilled they are being told it can not be refilled until Nov.   Please check on this and call patient

## 2021-03-13 ENCOUNTER — Encounter: Payer: Federal, State, Local not specified - PPO | Admitting: Psychology

## 2021-03-20 ENCOUNTER — Encounter: Payer: Federal, State, Local not specified - PPO | Admitting: Psychology

## 2021-06-10 ENCOUNTER — Encounter: Payer: Self-pay | Admitting: Neurology

## 2021-06-10 ENCOUNTER — Other Ambulatory Visit: Payer: Self-pay

## 2021-06-10 ENCOUNTER — Ambulatory Visit (INDEPENDENT_AMBULATORY_CARE_PROVIDER_SITE_OTHER): Payer: Federal, State, Local not specified - PPO | Admitting: Neurology

## 2021-06-10 VITALS — BP 106/71 | HR 63 | Ht 69.0 in | Wt 184.0 lb

## 2021-06-10 DIAGNOSIS — F0150 Vascular dementia without behavioral disturbance: Secondary | ICD-10-CM

## 2021-06-10 DIAGNOSIS — M79605 Pain in left leg: Secondary | ICD-10-CM | POA: Diagnosis not present

## 2021-06-10 DIAGNOSIS — M79604 Pain in right leg: Secondary | ICD-10-CM | POA: Diagnosis not present

## 2021-06-10 MED ORDER — GABAPENTIN 300 MG PO CAPS: ORAL_CAPSULE | ORAL | 11 refills | Status: DC

## 2021-06-10 NOTE — Patient Instructions (Addendum)
Good to see you. ? ?Schedule EMG/NCV of both legs ? ?2. Schedule MRI brain without contrast ? ?3. Increase Gabapentin to '300mg'$ : Take 1 capsule every night for 1 week, if no side effects, can increase to 2 capsules every night ? ?4. Continue close supervision ? ?5. Follow-up in 6 months, call for any changes ? ? ?FALL PRECAUTIONS: Be cautious when walking. Scan the area for obstacles that may increase the risk of trips and falls. When getting up in the mornings, sit up at the edge of the bed for a few minutes before getting out of bed. Consider elevating the bed at the head end to avoid drop of blood pressure when getting up. Walk always in a well-lit room (use night lights in the walls). Avoid area rugs or power cords from appliances in the middle of the walkways. Use a walker or a cane if necessary and consider physical therapy for balance exercise. Get your eyesight checked regularly. ? ?HOME SAFETY: Consider the safety of the kitchen when operating appliances like stoves, microwave oven, and blender. Consider having supervision and share cooking responsibilities until no longer able to participate in those. Accidents with firearms and other hazards in the house should be identified and addressed as well. ? ?ABILITY TO BE LEFT ALONE: If patient is unable to contact 911 operator, consider using [facility], or when the need is there, arrange for someone to stay with patients. Smoking is a fire hazard, consider supervision or cessation. Risk of wandering should be assessed by caregiver and if detected at any point, supervision and safe proof recommendations should be instituted. ? ? ?RECOMMENDATIONS FOR ALL PATIENTS WITH MEMORY PROBLEMS: ?1. Continue to exercise (Recommend 30 minutes of walking everyday, or 3 hours every week) ?2. Increase social interactions - continue going to [place] and enjoy social gatherings with friends and family ?3. Eat healthy, avoid fried foods and eat more fruits and vegetables ?4. Maintain  adequate blood pressure, blood sugar, and blood cholesterol level. Reducing the risk of stroke and cardiovascular disease also helps promoting better memory. ?5. Avoid stressful situations. Live a simple life and avoid aggravations. Organize your time and prepare for the next day in anticipation. ?6. Sleep well, avoid any interruptions of sleep and avoid any distractions in the bedroom that may interfere with adequate sleep quality ?7. Avoid sugar, avoid sweets as there is a strong link between excessive sugar intake, diabetes, and cognitive impairment ?The Mediterranean diet has been shown to help patients reduce the risk of progressive memory disorders and reduces cardiovascular risk. This includes eating fish, eat fruits and green leafy vegetables, nuts like almonds and hazelnuts, walnuts, and also use olive oil. Avoid fast foods and fried foods as much as possible. Avoid sweets and sugar as sugar use has been linked to worsening of memory function. ? ?There is always a concern of gradual progression of memory problems. If this is the case, then we may need to adjust level of care according to patient needs. Support, both to the patient and caregiver, should then be put into place. ? ?

## 2021-06-10 NOTE — Progress Notes (Signed)
? ?NEUROLOGY FOLLOW UP OFFICE NOTE ? ?Gregory Kline ?[id number] ?1945-05-30 ? ?HISTORY OF PRESENT ILLNESS: ?I had the pleasure of seeing Gregory Kline in follow-up in the neurology clinic on 06/09/2021.  The patient was last evaluated 8 months ago for difficulty concentrating and increasing headaches. His [name], [place] (cell [phone]) was on speakerphone to provide additional information. Records and images were personally reviewed where available.  He underwent Neuropsychological evaluation in 10/2020 with a diagnosis of Major Vascular Neurocognitive Disorder with primary impairments surrounding processing speed, cognitive flexibility, semantic fluency, and encoding aspects of verbal memory. Additional weaknesses relative to premorbid intellectual estimations were seen across basic attention. Complex attention/concentration and other aspects of executive functioning were unable to be assessed due to limited testing tolerance and [name] discontinuing the evaluation prematurely.  ? ?Since his last visit, Gregory Kline reports that his wife passed away unexpectedly and she had been managing his medical care. His daughters are now taking over and things are a little more confusing and difficult. He lives in his other [name] in-law suite. Main concern today is increasing leg pain, especially at night. Gregory Kline feels it is neuropathic, pain is in both legs, primarily the ankles and feet. He has difficulty describing the pain, saying "it is annoying." He has been on gabapentin '200mg'$  qhs for headache prophylaxis. He has CKD and was in the ER in 11/2020 with creatinine of 2.04. He goes to the [place] for majority of his care and has seen a neurologist there as well. Gregory Kline reports he sees a psychiatrist and amitriptyline was increased to '50mg'$  qhs, he also takes Tylenol and Abilify. He has a history of horseshoe kidney and RCC, neurofibromatosis. He had surgery in his neck which affected his left side. He  used to be on a higher dose of gabapentin for other reasons but she is unsure why it was reduced. He is not sleeping, partly due to grieving/depression, but also the leg pain. We discussed Neuropsychological testing results, which is not a surprise for family. Gregory Kline fixes his pillbox and administers night medications, he is expected to take his morning medications but they call to remind him. He has stopped driving. His other daughter in [place] manages his finances.  ? ? ?History on Initial Assessment 10/25/2017: This is a pleasant 76 year old right-handed man with a history of left cerebellar stroke at age 40, hydrocephalus s/p VP shunt, renal cell carcinoma s/p left nephrectomy, hypertension, hyperlipidemia, presenting after hospital visit for altered mental status. His wife reports that over the past few months, he has had more trouble concentrating and difficulty staying on track. They deny any nonsensical speech. He dozes off to sleep frequently. He has also been having more headaches, she reports that he has had headaches for several years, usually getting better by spring time, but recently he has gone through a surge of clusters of headaches lasting 2-3 days at a time. Longest headache-free interval is 2-3 days. Headaches are usually worse in the Fall, but she states things have changed. He describes different kinds of pain on either temporal region and around his eyes lasting several hours. Acetaminophen+caffeine do not help. There is no associated nausea/vomiting. Sometimes he is sensitive to lights and sounds. He states it has affected his mood as well, "I don't like me with it," depression is worse, he gets irritated more easily. At baseline, he has repetitive upward eye rolling movements, but she states that these have also increased in frequency since December 2018.  Due to worsening symptoms, he went to the ER on 10/20/2017 where he had a head CT and MRI brain without contrast which I personally  reviewed, no acute changes seen, there was chronic left inferior cerebellar infarct, right ventriculostomy catheter in place, ventricles are small relative to the degree of atrophy, suggesting the possibility of overshunting. Images were reviewed by neurosurgeon Dr. [name] who stated that this is likely a chronic finding and shunt has likely been like this for many years, recommending outpatient neurosurgery follow-up. There is note of a prior MRI brain done 05/2015, images unavailable for review, with right frontal approach ventricular shunt with ventricles appearing relatively decompressed relative to degree of cerebral volume loss. ?  ?His wife denies any staring episodes. Sometimes he does not respond but she feels this is due to his hearing loss. They recall being told in 2017 that there was a slight blockage in his VP shunt, but now were told there was "more than one place blocked." He denies any dizziness, diplopia, dysarthria/dysphagia, neck pain, focal numbness/tingling/weakness, bowel dysfunction. He has low back pain and urinary frequency. He recalls a fall this year during his exercise class.  ?  ? ?PAST MEDICAL HISTORY: ?Past Medical History:  ?Diagnosis Date  ? Allergic rhinitis   ? Benign prostatic hyperplasia with lower urinary tract symptoms   ? Bilateral inguinal hernia 08/07/2012  ? Chronic back pain   ? Chronic kidney disease, stage III (moderate) 02/17/2011  ? Chronic migraine without aura, intractable, without status migrainosus   ? CVA (cerebral infarction)   ? left inferior cerebellar stroke while in 36s  ? Disorder of bone and cartilage 09/26/2001  ? DEXA - 5/03 Femur Score  -2.4  ? Diverticulosis of colon   ? 1995 abcess with colovesical fistula - repaired Oct 10, 2001  ? Diverticulosis of intestine with bleeding 02/20/2011  ? history of GI bleed  ? Essential hypertension 02/17/2011  ? Gastroesophageal reflux disease 04/29/2015  ? EGD=nl 11.2012 + 5.2015 + 2017 including E+G+D Bx.  ?  Generalized anxiety disorder   ? History of cholecystectomy   ? Horseshoe kidney   ? Hydrocephalus   ? with shunting; stemming from prior CVA  ? Hyperlipemia   ? Hyperparathyroidism 12/02/1999  ? s/p parathyroidectomy 1997 with right thyroid lobectomy  ? Hypophosphatemia 02/21/2011  ? Iron deficiency 08/05/2014  ? w/o Anemia *Ferritin=32 Low iron  ? Lactose intolerance   ? Leukocytosis 02/19/2011  ? Major depressive disorder   ? generally mild symptoms since childhood  ? 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  ? s/p [facility]; MRI 12/2002 - no evidene of recurrence  ? Obstructive sleep apnea 11/06/2012  ? not adherent w/CPAP  ? Pain in joints of right hand   ? Pain in left wrist   ? Primary open-angle glaucoma, bilateral, stage unspecified   ? Restless legs syndrome   ? S/P VP shunt   ? Sensorineural hearing loss (SNHL) of both ears   ? Third degree hemorrhoids   ? Tubular adenoma of colon 06/22/2000  ? 3MM TUBULAR ADENOMA REMOVED FROM DESCENDING COLON 08/1998  ? Urinary calculus 01/20/2011  ? urinary bladder  ? Vascular dementia 11/07/2020  ? Vitamin D deficiency   ? ? ?MEDICATIONS: ?Current Outpatient Medications on File Prior to Visit  ?Medication Sig Dispense Refill  ? acetaminophen (TYLENOL) 500 MG tablet Take 1,000 mg by mouth 2 (two) times daily.    ? ACETAMINOPHEN-BUTALBITAL 50-325 MG TABS Take by mouth.    ?  amitriptyline (ELAVIL) 25 MG tablet Take 25 mg by mouth at bedtime.    ? ARIPiprazole (ABILIFY) 10 MG tablet Take 10 mg by mouth at bedtime.    ? aspirin EC 81 MG tablet Take 81 mg by mouth daily.    ? atorvastatin (LIPITOR) 20 MG tablet Take 40 mg by mouth daily.    ? dextran 70-hypromellose (TEARS RENEWED) ophthalmic solution Place 1 drop into both eyes at bedtime.     ? DULoxetine (CYMBALTA) 60 MG capsule Take 60 mg by mouth 2 (two) times daily.    ? finasteride (PROSCAR) 5 MG tablet Take 5 mg by mouth daily.    ? gabapentin (NEURONTIN) 100 MG capsule Take 2 capsules  every evening 180 capsule 3  ? levothyroxine (SYNTHROID, LEVOTHROID) 100 MCG tablet Take 100 mcg by mouth daily before breakfast.    ? lisinopril (ZESTRIL) 10 MG tablet Take 10 mg by mouth daily.    ? Miconazole

## 2021-06-11 ENCOUNTER — Telehealth: Payer: Self-pay | Admitting: Neurology

## 2021-06-11 NOTE — Telephone Encounter (Signed)
Pt called back in again and left a message with the access nurse stating he is having pain in the heels of his feet, knees, and his left side. ?

## 2021-06-11 NOTE — Telephone Encounter (Signed)
Pt stated that he is unsure if the gabapentin has been increased yet or not. I advised pt to give it time to work and that we do not given pain medications, pt stated that it does him no good to tell his Daughters that is why he is calling us. Pt advised that I will call him back once I hear from Dr Delice Lesch , pt was also wanting to know who was on his DPR he would not remember,  ?

## 2021-06-11 NOTE — Telephone Encounter (Signed)
Pls let him know that his Gabapentin dose has been increased to '300mg'$  qhs for 1 week, then he will increase it to '600mg'$  qhs. He will need to give it some time as you said. On his visit yesterday, he agreed to list his 2 other daughters on his DPR. ?

## 2021-06-11 NOTE — Telephone Encounter (Signed)
Pt called in and left a message with the access nurse after we closed yesterday. He stated he forgot to tell Dr. Delice Lesch he as having pain in his left hip and thigh area. ?

## 2021-06-12 NOTE — Telephone Encounter (Signed)
Left a voicemail to call the office back.

## 2021-06-12 NOTE — Telephone Encounter (Signed)
Pt called an informed per Dr Delice Lesch that Gabapentin dose has been increased to '300mg'$  qhs for 1 week, then he will increase it to '600mg'$  qhs. He will need to give it some time as you said. On his visit , he agreed to list his 2 other daughters on his DPR. ?

## 2021-06-12 NOTE — Telephone Encounter (Signed)
Pt called in and left a message returning our call ?

## 2021-07-09 ENCOUNTER — Ambulatory Visit: Payer: Federal, State, Local not specified - PPO | Admitting: Neurology

## 2021-07-09 DIAGNOSIS — M79605 Pain in left leg: Secondary | ICD-10-CM

## 2021-07-09 DIAGNOSIS — M79604 Pain in right leg: Secondary | ICD-10-CM | POA: Diagnosis not present

## 2021-07-09 NOTE — Procedures (Signed)
[facility]  ?[address] ? [place], [place] [address] ?Tel: [phone] ?Fax:  [phone] ?Test Date:  07/09/2021 ? ?Patient: Gregory Kline DOB: Apr 10, 1945 Physician: Narda Amber, DO  ?Sex: Male Height: '5\' 9"'$  Ref Phys: Ellouise Newer, M.D.  ?ID#: [id number]   Technician:   ? ?Patient Complaints: ?This is a 76 year old man referred for evaluation of bilateral leg pain. ? ?NCV & EMG Findings: ?Extensive electrodiagnostic testing of the right lower extremity and additional studies of the left shows:  ?Bilateral sural and superficial peroneal sensory responses are within normal limits. ?Bilateral tibial motor responses are within normal limits.  Bilateral peroneal motor responses are reduced at the extensor digitorum brevis (L2.1, R2.4 mV).   ?Bilateral tibial H reflex studies are within normal limits.   ?There is no evidence of active or chronic motor axonal loss changes affecting any of the tested muscles.  Motor unit configuration and recruitment pattern is within normal limits.   ? ?Impression: ?This is a normal study of the lower extremities.  In particular, there is no evidence of a large fiber sensorimotor polyneuropathy or lumbosacral radiculopathy. ? ? ?___________________________ ?Narda Amber, DO ? ? ? ?Nerve Conduction Studies ?Anti Sensory Summary Table ? ? Stim Site NR Peak (ms) Norm Peak (ms) P-T Amp (?V) Norm P-T Amp  ?Left Sup Peroneal Anti Sensory (Ant Lat Mall)  33?C  ?12 cm    2.7 <4.6 6.7 >3  ?Right Sup Peroneal Anti Sensory (Ant Lat Mall)  33?C  ?12 cm    2.4 <4.6 5.2 >3  ?Left Sural Anti Sensory (Lat Mall)  33?C  ?Calf    3.3 <4.6 5.6 >3  ?Right Sural Anti Sensory (Lat Mall)  33?C  ?Calf    2.0 <4.6 4.0 >3  ? ?Motor Summary Table ? ? Stim Site NR Onset (ms) Norm Onset (ms) O-P Amp (mV) Norm O-P Amp Site1 Site2 Delta-0 (ms) Dist (cm) Vel (m/s) Norm Vel (m/s)  ?Left Peroneal Motor (Ext Dig Brev)  33?C  ?Ankle    3.8 <6.0 2.1 >2.5 B Fib Ankle 8.5 36.0 42 >40  ?B Fib    12.3   1.5  Poplt B Fib 1.7 7.0 41 >40  ?Poplt    14.0  1.4         ?Right Peroneal Motor (Ext Dig Brev)  33?C  ?Ankle    3.5 <6.0 2.4 >2.5 B Fib Ankle 8.3 35.0 42 >40  ?B Fib    11.8  1.8  Poplt B Fib 1.5 8.0 53 >40  ?Poplt    13.3  1.8         ?Left Tibial Motor (Abd [place] Crane Brev)  33?C  ?Ankle    3.6 <6.0 4.6 >4 Knee Ankle 8.9 42.0 47 >40  ?Knee    12.5  2.7         ?Right Tibial Motor (Abd [place] Crane Brev)  33?C  ?Ankle    2.8 <6.0 5.4 >4 Knee Ankle 10.0 41.0 41 >40  ?Knee    12.8  4.1         ? ?H Reflex Studies ? ? NR H-Lat (ms) Lat Norm (ms) L-R H-Lat (ms)  ?Left Tibial (Gastroc)  33?C  ?   34.42 <35 0.00  ?Right Tibial (Gastroc)  33?C  ?   34.42 <35 0.00  ? ?EMG ? ? Side Muscle Ins Act Fibs Psw Fasc Number Recrt Dur Dur. Amp Amp. Poly Poly. Comment  ?Right AntTibialis Nml Nml Nml Nml Nml  Nml Nml Nml Nml Nml Nml Nml N/A  ?Right Gastroc Nml Nml Nml Nml Nml Nml Nml Nml Nml Nml Nml Nml N/A  ?Right Flex Dig Long Nml Nml Nml Nml Nml Nml Nml Nml Nml Nml Nml Nml N/A  ?Right RectFemoris Nml Nml Nml Nml Nml Nml Nml Nml Nml Nml Nml Nml N/A  ?Left AntTibialis Nml Nml Nml Nml Nml Nml Nml Nml Nml Nml Nml Nml N/A  ?Left Gastroc Nml Nml Nml Nml Nml Nml Nml Nml Nml Nml Nml Nml N/A  ?Left Flex Dig Long Nml Nml Nml Nml Nml Nml Nml Nml Nml Nml Nml Nml N/A  ?Left RectFemoris Nml Nml Nml Nml Nml Nml Nml Nml Nml Nml Nml Nml N/A  ? ? ? ? ?Waveforms: ?    ? ?    ? ?    ? ?  ? ? ?

## 2021-07-10 ENCOUNTER — Ambulatory Visit
Admission: RE | Admit: 2021-07-10 | Discharge: 2021-07-10 | Disposition: A | Discharge status: Home / Self Care | Payer: Federal, State, Local not specified - PPO | Source: Ambulatory Visit | Attending: Neurology | Admitting: Neurology

## 2021-07-10 DIAGNOSIS — F0150 Vascular dementia without behavioral disturbance: Secondary | ICD-10-CM

## 2021-07-17 ENCOUNTER — Telehealth: Payer: Self-pay | Admitting: Neurology

## 2021-07-17 NOTE — Telephone Encounter (Signed)
Patients daughter called and stated she wanted to see if she could get the results of her dads MRI and EMG. ?

## 2021-07-17 NOTE — Telephone Encounter (Signed)
Patient results are not resulted I advised will call when results are available.   ?

## 2021-07-27 NOTE — Telephone Encounter (Signed)
Spoke to [place]. Discussed brain MRI and EMG/NCV results. He is having a hard time completing his thoughts, very difficult to have a conversation with him. He does not remember anything at all. He has not been complaining as he did every day with gabapentin 2 caps qhs. Has not been to his psychiatrist since mother passed, he is on grief counseling. Discussed touching base with psychiatry first and see if any med changes are needed so we make one change a time. May consider Aricept moving forward. Discussed transportation concerns, on his EMG staff was concerned about getting him into the [facility], family continues to work on this, would look into [facility] transportation. Also, they request to contact Clarise Cruz moving forward for appointments (do not call patient).  ?

## 2021-07-27 NOTE — Telephone Encounter (Signed)
Patient's daughter [name] called again to see if the results are ready. ?

## 2021-12-22 ENCOUNTER — Ambulatory Visit: Payer: Federal, State, Local not specified - PPO | Admitting: Neurology

## 2021-12-22 ENCOUNTER — Encounter: Payer: Self-pay | Admitting: Neurology

## 2021-12-22 VITALS — BP 144/81 | HR 84 | Ht 69.5 in | Wt 205.0 lb

## 2021-12-22 DIAGNOSIS — M79604 Pain in right leg: Secondary | ICD-10-CM

## 2021-12-22 DIAGNOSIS — M79605 Pain in left leg: Secondary | ICD-10-CM | POA: Diagnosis not present

## 2021-12-22 DIAGNOSIS — F0150 Vascular dementia without behavioral disturbance: Secondary | ICD-10-CM | POA: Diagnosis not present

## 2021-12-22 MED ORDER — GABAPENTIN 300 MG PO CAPS: ORAL_CAPSULE | ORAL | 3 refills | Status: DC

## 2021-12-22 NOTE — Patient Instructions (Signed)
Good to see you.  Continue Gabapentin '300mg'$ : take 2 capsules every night  2. Start Memantine '10mg'$  every night for 2 weeks, then increase to '10mg'$  twice a day  3. Continue close supervision  4. Follow-up with Memory Disorders PA Sharene Butters in 6 months, call for any changes   FALL PRECAUTIONS: Be cautious when walking. Scan the area for obstacles that may increase the risk of trips and falls. When getting up in the mornings, sit up at the edge of the bed for a few minutes before getting out of bed. Consider elevating the bed at the head end to avoid drop of blood pressure when getting up. Walk always in a well-lit room (use night lights in the walls). Avoid area rugs or power cords from appliances in the middle of the walkways. Use a walker or a cane if necessary and consider physical therapy for balance exercise. Get your eyesight checked regularly.  HOME SAFETY: Consider the safety of the kitchen when operating appliances like stoves, microwave oven, and blender. Consider having supervision and share cooking responsibilities until no longer able to participate in those. Accidents with firearms and other hazards in the house should be identified and addressed as well.   ABILITY TO BE LEFT ALONE: If patient is unable to contact 911 operator, consider using LifeLine, or when the need is there, arrange for someone to stay with patients. Smoking is a fire hazard, consider supervision or cessation. Risk of wandering should be assessed by caregiver and if detected at any point, supervision and safe proof recommendations should be instituted.    RECOMMENDATIONS FOR ALL PATIENTS WITH MEMORY PROBLEMS: 1. Continue to exercise (Recommend 30 minutes of walking everyday, or 3 hours every week) 2. Increase social interactions - continue going to [place] and enjoy social gatherings with friends and family 3. Eat healthy, avoid fried foods and eat more fruits and vegetables 4. Maintain adequate blood  pressure, blood sugar, and blood cholesterol level. Reducing the risk of stroke and cardiovascular disease also helps promoting better memory. 5. Avoid stressful situations. Live a simple life and avoid aggravations. Organize your time and prepare for the next day in anticipation. 6. Sleep well, avoid any interruptions of sleep and avoid any distractions in the bedroom that may interfere with adequate sleep quality 7. Avoid sugar, avoid sweets as there is a strong link between excessive sugar intake, diabetes, and cognitive impairment The Mediterranean diet has been shown to help patients reduce the risk of progressive memory disorders and reduces cardiovascular risk. This includes eating fish, eat fruits and green leafy vegetables, nuts like almonds and hazelnuts, walnuts, and also use olive oil. Avoid fast foods and fried foods as much as possible. Avoid sweets and sugar as sugar use has been linked to worsening of memory function.  There is always a concern of gradual progression of memory problems. If this is the case, then we may need to adjust level of care according to patient needs. Support, both to the patient and caregiver, should then be put into place.

## 2021-12-22 NOTE — Progress Notes (Unsigned)
NEUROLOGY FOLLOW UP OFFICE NOTE  Keith Felten [id number] 1945-06-11  HISTORY OF PRESENT ILLNESS: I had the pleasure of seeing Edris Friedt in follow-up in the neurology clinic on 12/22/2021.  The patient was last seen 6 months ago for vascular dementia and neuropathy. He is accompanied by his daughter [name] who helps supplement the history today.  Records and images were personally reviewed where available.  I personally reviewed brain MRI without contrast done 06/2021 which did not show any acute changes. There was moderate chronic microvascular disease and diffuse atrophy progressed compared to prior imaging in 2019. Right frontal ventriculostomy tract again noted. There were remote infarcts in the left parietal, left caudate head, and bilateral cerebellar regions. His EMG/NCV of both legs in 06/2021 was normal. He was started on Gabapentin '300mg'$  2 caps every night.  When asked how he feels, he states it is "debatable." [name] feels the gabapentin has helped, he dose not complain as much about waking up with pain in his legs. He states pain is in either leg once in a fall. He says he had one fall, [name] reports he has had a couple since last visit, last fall was a couple of weeks ago when he tripped over shoes. She manages his medications. His other daughter manages finances. He does not drive. He had been to the [place] for follow-up and Memantine prescription had been sent, they are waiting for delivery. He has episodes of diarrhea. He denies any headaches, dizziness. [name] denies any new personality changes, no hallucinations, he states "I'm naturally paranoid."    History on Initial Assessment 10/25/2017: This is a pleasant 76 year old right-handed man with a history of left cerebellar stroke at age 76, hydrocephalus s/p VP shunt, renal cell carcinoma s/p left nephrectomy, hypertension, hyperlipidemia, presenting after hospital visit for altered mental status. His wife reports that over the past few  months, he has had more trouble concentrating and difficulty staying on track. They deny any nonsensical speech. He dozes off to sleep frequently. He has also been having more headaches, she reports that he has had headaches for several years, usually getting better by spring time, but recently he has gone through a surge of clusters of headaches lasting 2-3 days at a time. Longest headache-free interval is 2-3 days. Headaches are usually worse in the Fall, but she states things have changed. He describes different kinds of pain on either temporal region and around his eyes lasting several hours. Acetaminophen+caffeine do not help. There is no associated nausea/vomiting. Sometimes he is sensitive to lights and sounds. He states it has affected his mood as well, "I don't like me with it," depression is worse, he gets irritated more easily. At baseline, he has repetitive upward eye rolling movements, but she states that these have also increased in frequency since December 2018. Due to worsening symptoms, he went to the ER on 10/20/2017 where he had a head CT and MRI brain without contrast which I personally reviewed, no acute changes seen, there was chronic left inferior cerebellar infarct, right ventriculostomy catheter in place, ventricles are small relative to the degree of atrophy, suggesting the possibility of overshunting. Images were reviewed by neurosurgeon Dr. [name] who stated that this is likely a chronic finding and shunt has likely been like this for many years, recommending outpatient neurosurgery follow-up. There is note of a prior MRI brain done 05/2015, images unavailable for review, with right frontal approach ventricular shunt with ventricles appearing relatively decompressed relative to degree  of cerebral volume loss.   His wife denies any staring episodes. Sometimes he does not respond but she feels this is due to his hearing loss. They recall being told in 2017 that there was a slight blockage  in his VP shunt, but now were told there was "more than one place blocked." He denies any dizziness, diplopia, dysarthria/dysphagia, neck pain, focal numbness/tingling/weakness, bowel dysfunction. He has low back pain and urinary frequency. He recalls a fall this year during his exercise class.   Diagnostic Data: Neuropsychological evaluation in 10/2020: diagnosis of Major Vascular Neurocognitive Disorder with primary impairments surrounding processing speed, cognitive flexibility, semantic fluency, and encoding aspects of verbal memory. Additional weaknesses relative to premorbid intellectual estimations were seen across basic attention. Complex attention/concentration and other aspects of executive functioning were unable to be assessed due to limited testing tolerance and Mr. Fruth discontinuing the evaluation prematurely.   Brain MRI without contrast done 06/2021 did not show any acute changes. There was moderate chronic microvascular disease and diffuse atrophy progressed compared to prior imaging in 2019. Right frontal ventriculostomy tract again noted. There were remote small infarcts in the left parietal infarct, left caudate head, and bilateral cerebellum.   EMG/NCV of both legs in 06/2021 was normal    PAST MEDICAL HISTORY: Past Medical History:  Diagnosis Date   Allergic rhinitis    Benign prostatic hyperplasia with lower urinary tract symptoms    Bilateral inguinal hernia 08/07/2012   Chronic back pain    Chronic kidney disease, stage III (moderate) 02/17/2011   Chronic migraine without aura, intractable, without status migrainosus    CVA (cerebral infarction)    left inferior cerebellar stroke while in 78s   Disorder of bone and cartilage 09/26/2001   DEXA - 5/03 Femur Score  -2.4   Diverticulosis of colon    1995 abcess with colovesical fistula - repaired Oct 10, 2001   Diverticulosis of intestine with bleeding 02/20/2011   history of GI bleed   Essential hypertension  02/17/2011   Gastroesophageal reflux disease 04/29/2015   EGD=nl 11.2012 + 5.2015 + 2017 including E+G+D Bx.   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   s/p resection at [facility] 2003; MRI 12/2002 - no evidene of recurrence   Obstructive sleep apnea 11/06/2012   not adherent w/CPAP   Pain in joints of right hand    Pain in left wrist    Primary open-angle glaucoma, bilateral, stage unspecified    Restless legs syndrome    S/P VP shunt    Sensorineural hearing loss (SNHL) of both ears    Third degree hemorrhoids    Tubular adenoma of colon 06/22/2000   3MM TUBULAR ADENOMA REMOVED FROM DESCENDING COLON 08/1998   Urinary calculus 01/20/2011   urinary bladder   Vascular dementia 11/07/2020   Vitamin D deficiency     MEDICATIONS: Current Outpatient Medications on File Prior to Visit  Medication Sig Dispense Refill   acetaminophen (TYLENOL) 500 MG tablet Take 1,000 mg by mouth 2 (  two) times daily.     ACETAMINOPHEN-BUTALBITAL 50-325 MG TABS Take by mouth.     amitriptyline (ELAVIL) 25 MG tablet Take 25 mg by mouth at bedtime.     ARIPiprazole (ABILIFY) 10 MG tablet Take 10 mg by mouth at bedtime.     aspirin EC 81 MG tablet Take 81 mg by mouth daily.     atorvastatin (LIPITOR) 20 MG tablet Take 40 mg by mouth daily.     dextran 70-hypromellose (TEARS RENEWED) ophthalmic solution Place 1 drop into both eyes at bedtime.      DULoxetine (CYMBALTA) 60 MG capsule Take 60 mg by mouth 2 (two) times daily.      finasteride (PROSCAR) 5 MG tablet Take 5 mg by mouth daily.     gabapentin (NEURONTIN) 300 MG capsule Take 1 capsule every night for 1 week, then increase to 2 capsules every night 60 capsule 11   levothyroxine (SYNTHROID, LEVOTHROID) 100 MCG tablet Take 100 mcg by mouth daily before breakfast.     lisinopril (ZESTRIL) 10 MG tablet Take 10 mg by mouth daily.     melatonin 1 MG TABS tablet Take 1 mg by mouth at bedtime.     methocarbamol (ROBAXIN) 500 MG tablet TAKE ONE TABLET BY MOUTH AFTER SUPPER FOR BACK PAIN, MUSCLE PAIN, MUSCLE SPASM, TAKE IN THE EVENING, WHEN  NEEDED.NO DRIVING FOR 8 HOURS. FOR BACK PAIN, MUSCLE PAIN, MUSCLE SPASM, TAKE IN THE EVENING, WHEN   NEEDED.NO DRIVING FOR 8 HOURS.     Miconazole Nitrate 2 % POWD Apply to affected area twice daily for 2 weeks (Patient not taking: Reported on 06/10/2021) 100 g 0   modafinil (PROVIGIL) 200 MG tablet Take by mouth. (Patient not taking: Reported on 06/10/2021)     Multiple Vitamin (MULTI-VITAMINS) TABS Take by mouth.     MYRBETRIQ 50 MG TB24 tablet      pantoprazole (PROTONIX) 40 MG tablet Take 40 mg by mouth daily.     risedronate (ACTONEL) 35 MG tablet Take 35 mg by mouth 3 (three) times a week. with water on empty stomach, nothing by mouth or lie down for next 30 minutes.takes on sunday     SUMAtriptan (IMITREX) 100 MG tablet Take 100 mg by mouth 2 (two) times daily as needed for migraine.     tamsulosin (FLOMAX) 0.4 MG CAPS capsule Take 0.8 mg by mouth.     terbinafine (LAMISIL) 250 MG tablet Take 1 tablet (250 mg total) by mouth daily. (Patient not taking: Reported on 06/10/2021) 10 tablet 0   Travoprost, BAK Free, (TRAVATAN) 0.004 % SOLN ophthalmic solution Place 1 drop into both eyes at bedtime.   (Patient not taking: Reported on 06/10/2021)     UNABLE TO FIND Med Name: Acetaminophen-Butal-Caffeine [id number] Per daughter report, 2 pills twice daily as needed     No current facility-administered medications on file prior to visit.     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    Left handed   Social Determinants of Health   Financial Resource Strain: Not on file  Food Insecurity: Not on file  Transportation Needs: Not on file  Physical Activity: Not on file  Stress: Not on file  Social Connections: Not on file  Intimate Partner Violence: Not on file     PHYSICAL EXAM: Vitals:   12/22/21 1435  BP: (!) 144/81  Pulse: 84  SpO2: 98%   General: No acute distress Head:  Normocephalic/atraumatic Skin/Extremities: No rash, no edema Neurological Exam: alert and awake. No aphasia or dysarthria. Fund of knowledge is reduced.  Recent and remote memory are impaired. Attention and concentration are normal.   Cranial nerves: Pupils equal, round. Extraocular movements intact with no nystagmus. Visual  fields full.  No facial asymmetry.  Motor: Bulk and tone normal, muscle strength 5/5 throughout with no pronator drift.   Finger to nose testing intact.  Gait slow and cautious, no ataxia.    IMPRESSION: This is a 76 yo LH man with a history of left cerebellar stroke at age 29, hydrocephalus s/p VP shunt, renal cell carcinoma s/p left nephrectomy, hypertension, hyperlipidemia, who initially presented in 2019 for altered mental status. Neuropsychological evaluation in 10/2020 indicated Vascular Cognitive Impairment (vascular dementia). MRI brain showed moderate chronic microvascular disease and atrophy, remote infarcts in the left left parietal, left caudate head, and bilateral cerebellar regions. Continue daily aspirin, control of vascular risk factors. We discussed starting medication for dementia, discussing side effects and expectations. He has diarrhea, would avoid Donepezil. Memantine has been prescribed by the [place], start '10mg'$  qhs for 2 weeks, then increase to '10mg'$  BID. Continue Gabapentin '600mg'$  qhs for leg pain. Continue close supervision. He does not drive. Follow-up with [facility] Sharene Butters in 6 months, they know to call for any changes.   Thank you for allowing me to participate in his care.  Please do not hesitate to call for any questions or concerns.    Ellouise Newer, M.D.   CC: Dr. Ronnald Ramp

## 2021-12-23 ENCOUNTER — Encounter: Payer: Self-pay | Admitting: Neurology

## 2022-05-27 ENCOUNTER — Encounter: Payer: Self-pay | Admitting: Physician Assistant

## 2022-07-05 ENCOUNTER — Ambulatory Visit: Payer: Federal, State, Local not specified - PPO | Admitting: Physician Assistant

## 2022-07-16 ENCOUNTER — Ambulatory Visit: Payer: Federal, State, Local not specified - PPO | Admitting: Physician Assistant

## 2022-09-27 ENCOUNTER — Telehealth: Payer: Self-pay | Admitting: Neurology

## 2022-09-27 NOTE — Telephone Encounter (Signed)
Pt daughter left message on the VM stating that the patient has moved and she needs help with his medication and has some questions

## 2022-09-29 NOTE — Telephone Encounter (Signed)
Pt has moved to [place] and getting set up with new doctors and he does not have his gabapentin pt daughter is asking if we can send a RX to him

## 2022-09-29 NOTE — Telephone Encounter (Signed)
Pt daughter Huntley Dec is going to call back with the name of the pharmacy so we can send in a 3 month supply

## 2022-09-29 NOTE — Telephone Encounter (Signed)
Ok to send 3 months refills until he gets new MD, thanks

## 2022-10-01 ENCOUNTER — Telehealth: Payer: Self-pay | Admitting: Physician Assistant

## 2022-10-01 NOTE — Telephone Encounter (Signed)
Patient's new pharmacy is [facility]- on [address] In [place].

## 2022-10-05 MED ORDER — GABAPENTIN 300 MG PO CAPS: ORAL_CAPSULE | ORAL | 0 refills | Status: AC

## 2022-10-05 NOTE — Addendum Note (Signed)
Addended by: Dimas Chyle on: 10/05/2022 04:33 PM   Modules accepted: Orders

## 2022-10-05 NOTE — Telephone Encounter (Signed)
Medication sent in. 

## 2022-10-15 ENCOUNTER — Telehealth: Payer: Self-pay | Admitting: Neurology

## 2022-10-15 NOTE — Telephone Encounter (Signed)
She is going to contact pharmacy, rx was sent in one week ago.

## 2022-10-15 NOTE — Telephone Encounter (Signed)
Pt daughter wants to speak to someone about medication Gabapentin   She state that the patient moved out of town and is having trouble getting RX due to the fact that she had just picked the medication up and it against the law to mail it to him   Please call

## 2023-12-26 IMAGING — MR MR HEAD W/O CM
10 series · 48 of 48 positions shown · non-contrast
Comparison: MR head without contrast 10/20/2017

CLINICAL DATA: Dementia.  Vascular etiology suspected.

EXAM:
MRI HEAD WITHOUT CONTRAST
TECHNIQUE: Multiplanar, multiecho pulse sequences of the brain and surrounding
structures were obtained without intravenous contrast.

[Series 2: T1 · sagittal · 5.0mm · 0.51mm/px · 3 of 24 slices shown]
[im 1/24]
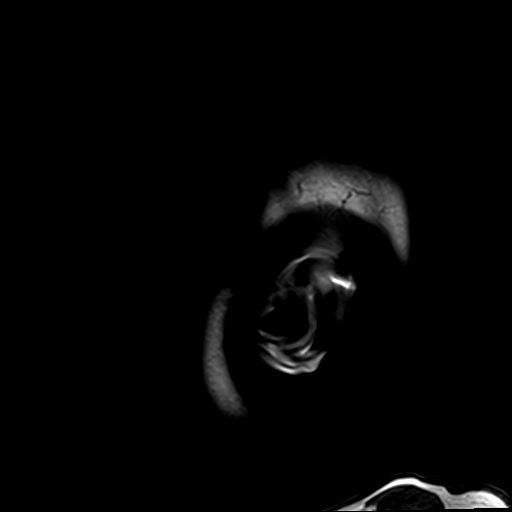
[im 12/24]
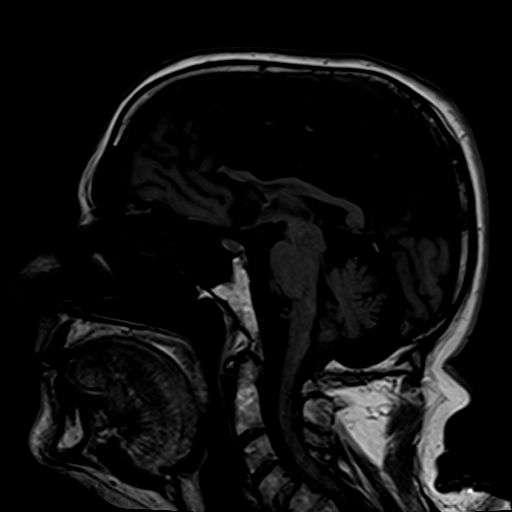
[im 24/24]
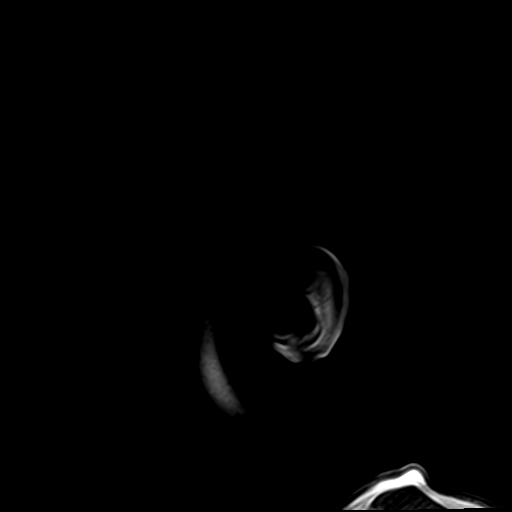

[Series 3: DWI · axial · 3.0mm · 2.03mm/px · z∈[-43,+114]mm · 9 of 112 slices shown (1 of 4)]
[im 1/112]
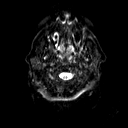
[im 14/112]
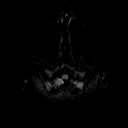
[im 28/112]
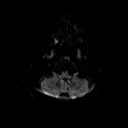
[im 42/112]
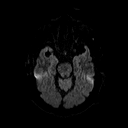
[im 56/112]
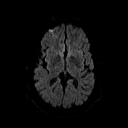
[im 70/112]
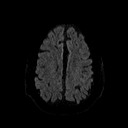
[im 84/112]
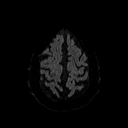
[im 98/112]
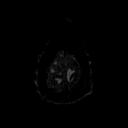
[im 112/112]
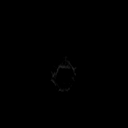

[Series 4: DWI · axial · 3.0mm · 2.03mm/px · z∈[-43,+114]mm · 4 of 55 slices shown (2 of 4)]
[im 1/55]
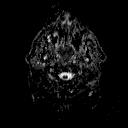
[im 19/55]
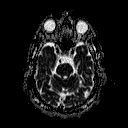
[im 37/55]
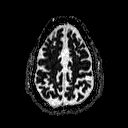
[im 55/55]
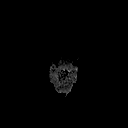

[Series 5: DWI · coronal · 5.0mm · 1.80mm/px · 6 of 78 slices shown (3 of 4)]
[im 1/78]
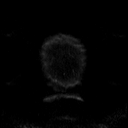
[im 16/78]
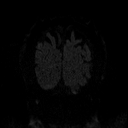
[im 31/78]
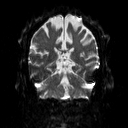
[im 47/78]
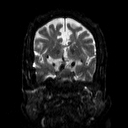
[im 62/78]
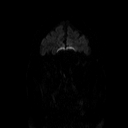
[im 78/78]
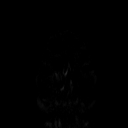

[Series 6: DWI · coronal · 5.0mm · 1.80mm/px · 3 of 39 slices shown (4 of 4)]
[im 1/39]
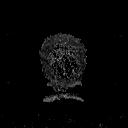
[im 20/39]
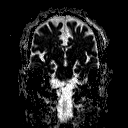
[im 39/39]
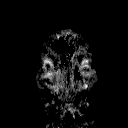

[Series 7: T2 · axial · 5.0mm · 0.65mm/px · z∈[-43,+111]mm · 2 of 24 slices shown (1 of 2)]
[im 1/24]
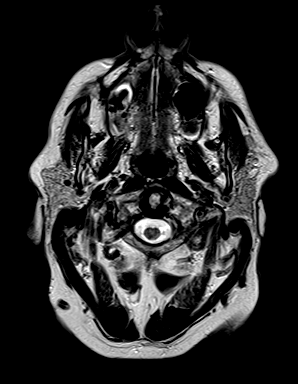
[im 24/24]
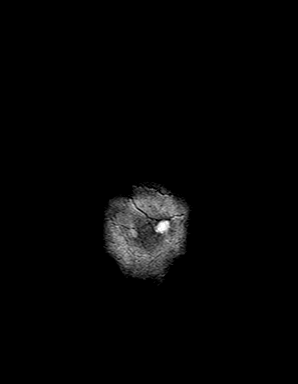

[Series 8: FLAIR · axial · 3.0mm · 0.49mm/px · z∈[-41,+109]mm · 3 of 35 slices shown]
[im 1/35]
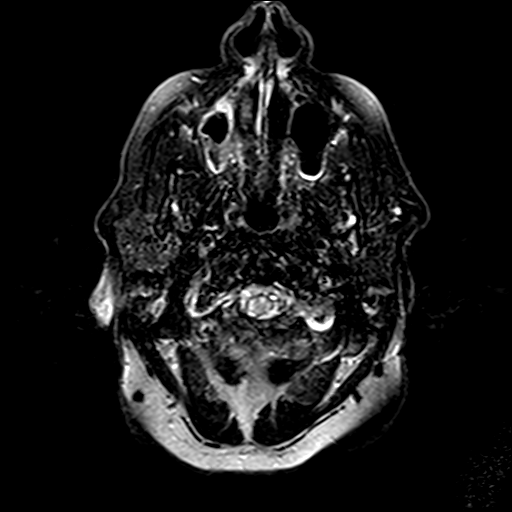
[im 18/35]
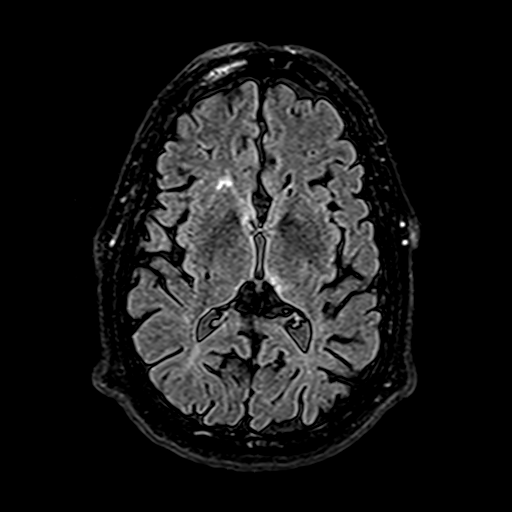
[im 35/35]
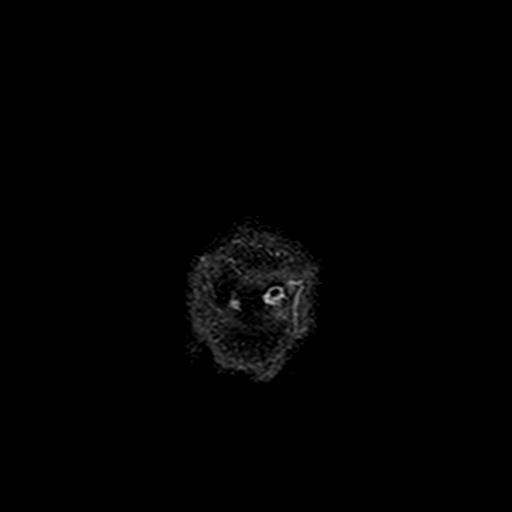

[Series 10: swi_images · axial · 4.0mm · 0.98mm/px · z∈[-39,+110]mm · 3 of 40 slices shown]
[im 1/40]
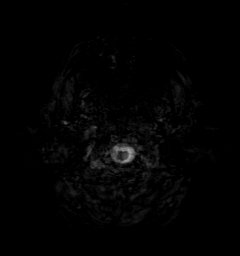
[im 20/40]
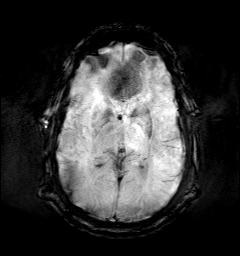
[im 40/40]
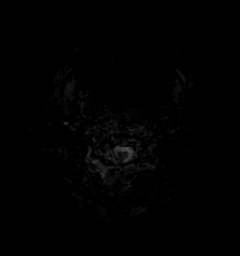

[Series 11: t1_mpr_(person_name) · axial · 1.0mm · 0.78mm/px · z∈[-40,+111]mm · 13 of 160 slices shown]
[im 1/160]
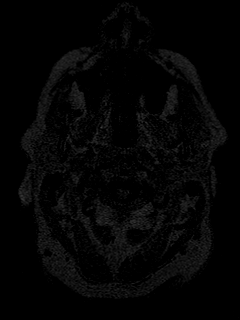
[im 14/160]
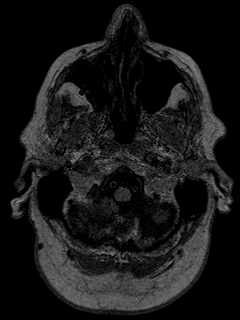
[im 27/160]
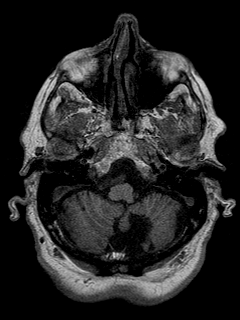
[im 40/160]
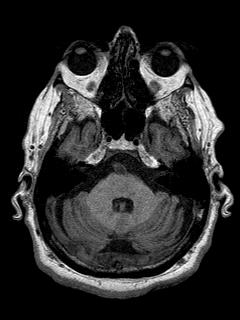
[im 54/160]
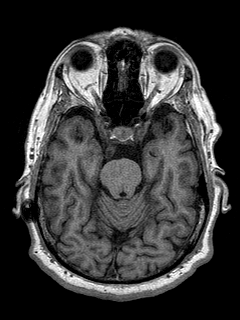
[im 67/160]
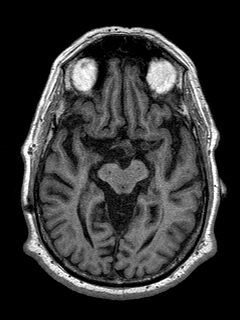
[im 80/160]
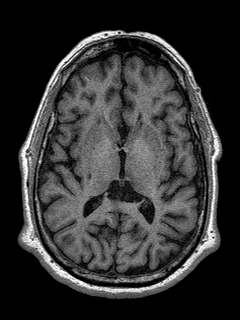
[im 93/160]
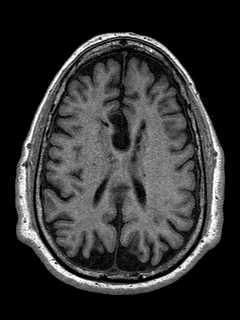
[im 107/160]
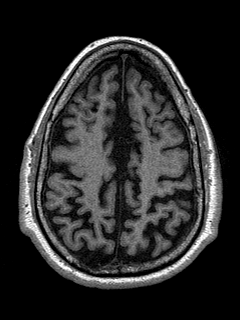
[im 120/160]
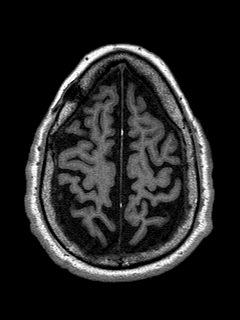
[im 133/160]
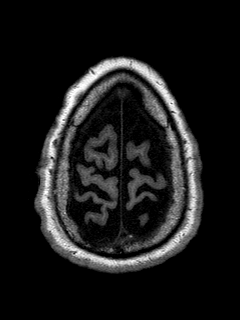
[im 146/160]
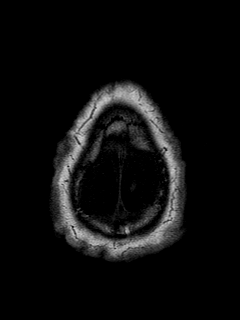
[im 160/160]
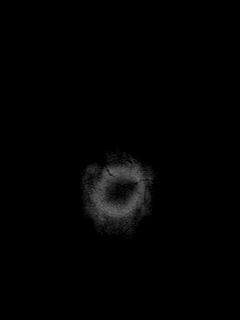

[Series 12: T2 · coronal · 5.0mm · 0.45mm/px · 2 of 30 slices shown (2 of 2)]
[im 1/30]
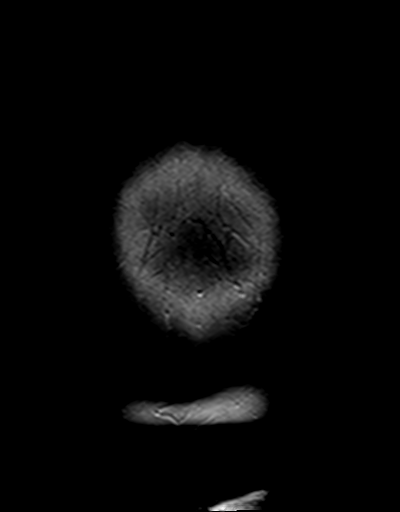
[im 30/30]
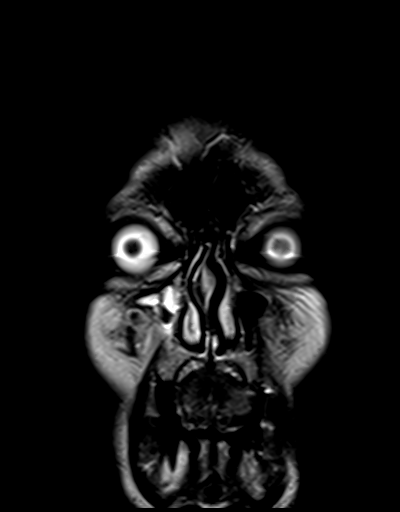

[48 of 48 positions shown; findings below may reference images not displayed]

FINDINGS: Brain: No acute infarct, hemorrhage, or mass lesion is present.
Moderate generalized atrophy demonstrates interval progression.
Right frontal ventriculostomy tract again noted. Remote left
parietal infarct noted. Remote infarcts of the left caudate head
again noted. White matter changes extend into the brainstem. Remote
lacunar infarcts are present in the cerebellum bilaterally, stable.

The ventricles are of normal size. No significant extraaxial fluid
collection is present.

The internal auditory canals are within normal limits.

Vascular: Flow is present in the major intracranial arteries.

Skull and upper cervical spine: The craniocervical junction is
normal. Upper cervical spine is within normal limits. Marrow signal
is unremarkable.

Sinuses/Orbits: Chronic shrunken right maxillary sinus noted. Mild
mucosal thickening is present in the right maxillary sinus. The
paranasal sinuses and mastoid air cells are otherwise clear. The
globes and orbits are within normal limits.
IMPRESSION: 1. No acute intracranial abnormality.
2. Moderate generalized atrophy and white matter disease has
progressed. This likely reflects the sequela of chronic
microvascular ischemia.
3. Remote left parietal infarct.
4. Remote lacunar infarcts of the left caudate head and cerebellum
are stable.
